# Patient Record
Sex: Male | Born: 1950 | Race: White | Hispanic: No | Marital: Single | Smoking: Never smoker
Health system: Southern US, Community
[De-identification: ages and names within clinical notes are randomized; demographics above are authoritative.]

## PROBLEM LIST (undated history)

## (undated) DIAGNOSIS — I5032 Chronic diastolic (congestive) heart failure: Secondary | ICD-10-CM

## (undated) DIAGNOSIS — L03031 Cellulitis of right toe: Secondary | ICD-10-CM

## (undated) DIAGNOSIS — K7460 Unspecified cirrhosis of liver: Secondary | ICD-10-CM

## (undated) DIAGNOSIS — E785 Hyperlipidemia, unspecified: Secondary | ICD-10-CM

## (undated) DIAGNOSIS — I2510 Atherosclerotic heart disease of native coronary artery without angina pectoris: Secondary | ICD-10-CM

## (undated) DIAGNOSIS — I428 Other cardiomyopathies: Secondary | ICD-10-CM

## (undated) DIAGNOSIS — F329 Major depressive disorder, single episode, unspecified: Secondary | ICD-10-CM

## (undated) DIAGNOSIS — I10 Essential (primary) hypertension: Secondary | ICD-10-CM

## (undated) DIAGNOSIS — F32A Depression, unspecified: Secondary | ICD-10-CM

## (undated) DIAGNOSIS — M1990 Unspecified osteoarthritis, unspecified site: Secondary | ICD-10-CM

## (undated) DIAGNOSIS — F419 Anxiety disorder, unspecified: Secondary | ICD-10-CM

## (undated) HISTORY — PX: CERVICAL SPINE SURGERY: SHX589

## (undated) HISTORY — DX: Atherosclerotic heart disease of native coronary artery without angina pectoris: I25.10

## (undated) HISTORY — PX: CARPAL TUNNEL RELEASE: SHX101

## (undated) HISTORY — DX: Cellulitis of right toe: L03.031

## (undated) HISTORY — DX: Chronic diastolic (congestive) heart failure: I50.32

## (undated) HISTORY — DX: Other cardiomyopathies: I42.8

## (undated) HISTORY — DX: Hyperlipidemia, unspecified: E78.5

## (undated) HISTORY — PX: COLONOSCOPY: SHX174

---

## 2008-01-01 HISTORY — PX: OTHER SURGICAL HISTORY: SHX169

## 2008-01-08 HISTORY — PX: NM MYOVIEW LTD: HXRAD82

## 2008-01-21 ENCOUNTER — Ambulatory Visit (HOSPITAL_COMMUNITY): Admission: RE | Admit: 2008-01-21 | Discharge: 2008-01-21 | Payer: Self-pay | Admitting: *Deleted

## 2010-02-28 ENCOUNTER — Emergency Department (HOSPITAL_COMMUNITY): Admission: EM | Admit: 2010-02-28 | Discharge: 2010-02-28 | Payer: Self-pay | Admitting: Emergency Medicine

## 2010-03-24 ENCOUNTER — Emergency Department (HOSPITAL_COMMUNITY): Admission: EM | Admit: 2010-03-24 | Discharge: 2010-03-24 | Payer: Self-pay | Admitting: Emergency Medicine

## 2010-05-09 HISTORY — PX: DOPPLER ECHOCARDIOGRAPHY: SHX263

## 2010-05-23 HISTORY — PX: OTHER SURGICAL HISTORY: SHX169

## 2010-05-24 ENCOUNTER — Ambulatory Visit (HOSPITAL_COMMUNITY)
Admission: RE | Admit: 2010-05-24 | Discharge: 2010-05-24 | Payer: Self-pay | Source: Home / Self Care | Attending: Internal Medicine | Admitting: Internal Medicine

## 2010-05-24 HISTORY — PX: CARDIAC CATHETERIZATION: SHX172

## 2010-08-07 LAB — GLUCOSE, CAPILLARY
Glucose-Capillary: 223 mg/dL — ABNORMAL HIGH (ref 70–99)
Glucose-Capillary: 225 mg/dL — ABNORMAL HIGH (ref 70–99)
Glucose-Capillary: 230 mg/dL — ABNORMAL HIGH (ref 70–99)
Glucose-Capillary: 314 mg/dL — ABNORMAL HIGH (ref 70–99)

## 2010-08-07 LAB — BASIC METABOLIC PANEL
BUN: 18 mg/dL (ref 6–23)
CO2: 28 mEq/L (ref 19–32)
Calcium: 9.2 mg/dL (ref 8.4–10.5)
Chloride: 100 mEq/L (ref 96–112)
Creatinine, Ser: 0.81 mg/dL (ref 0.4–1.5)
GFR calc Af Amer: >60 mL/min (ref >60)
GFR calc non Af Amer: >60 mL/min (ref >60)
Glucose, Bld: 227 mg/dL — ABNORMAL HIGH (ref 70–99)
Potassium: 3.7 mEq/L (ref 3.5–5.1)
Sodium: 137 mEq/L (ref 135–145)

## 2010-08-09 LAB — GLUCOSE, CAPILLARY: Glucose-Capillary: 323 mg/dL — ABNORMAL HIGH (ref 70–99)

## 2010-11-25 ENCOUNTER — Inpatient Hospital Stay (HOSPITAL_COMMUNITY)
Admission: EM | Admit: 2010-11-25 | Discharge: 2010-12-01 | DRG: 603 | Disposition: A | Discharge status: Home Health | Payer: Medicare Other | Attending: Internal Medicine | Admitting: Internal Medicine

## 2010-11-25 ENCOUNTER — Emergency Department (HOSPITAL_COMMUNITY): Payer: Medicare Other

## 2010-11-25 DIAGNOSIS — E860 Dehydration: Secondary | ICD-10-CM | POA: Diagnosis present

## 2010-11-25 DIAGNOSIS — R911 Solitary pulmonary nodule: Secondary | ICD-10-CM | POA: Diagnosis present

## 2010-11-25 DIAGNOSIS — E876 Hypokalemia: Secondary | ICD-10-CM | POA: Diagnosis present

## 2010-11-25 DIAGNOSIS — M1990 Unspecified osteoarthritis, unspecified site: Secondary | ICD-10-CM | POA: Diagnosis present

## 2010-11-25 DIAGNOSIS — L03039 Cellulitis of unspecified toe: Principal | ICD-10-CM | POA: Diagnosis present

## 2010-11-25 DIAGNOSIS — I10 Essential (primary) hypertension: Secondary | ICD-10-CM | POA: Diagnosis present

## 2010-11-25 DIAGNOSIS — G4700 Insomnia, unspecified: Secondary | ICD-10-CM | POA: Diagnosis present

## 2010-11-25 DIAGNOSIS — L02619 Cutaneous abscess of unspecified foot: Principal | ICD-10-CM | POA: Diagnosis present

## 2010-11-25 DIAGNOSIS — E119 Type 2 diabetes mellitus without complications: Secondary | ICD-10-CM | POA: Diagnosis present

## 2010-11-25 DIAGNOSIS — I2510 Atherosclerotic heart disease of native coronary artery without angina pectoris: Secondary | ICD-10-CM | POA: Diagnosis present

## 2010-11-25 HISTORY — DX: Essential (primary) hypertension: I10

## 2010-11-25 LAB — URINALYSIS, ROUTINE W REFLEX MICROSCOPIC
Glucose, UA: NEGATIVE mg/dL
Hgb urine dipstick: NEGATIVE
Ketones, ur: 15 mg/dL — AB
Leukocytes, UA: NEGATIVE
Nitrite: NEGATIVE
Protein, ur: 100 mg/dL — AB
Specific Gravity, Urine: >1.03 — ABNORMAL HIGH (ref 1.005–1.030)
pH: 5.5 (ref 5.0–8.0)

## 2010-11-25 LAB — URINE MICROSCOPIC-ADD ON

## 2010-11-25 LAB — CBC
HCT: 41.3 % (ref 39.0–52.0)
MCH: 33.5 pg (ref 26.0–34.0)
MCHC: 34.9 g/dL (ref 30.0–36.0)
MCV: 96 fL (ref 78.0–100.0)
Platelets: 187 10*3/uL (ref 150–400)
RBC: 4.3 MIL/uL (ref 4.22–5.81)
WBC: 23.3 10*3/uL — ABNORMAL HIGH (ref 4.0–10.5)

## 2010-11-25 LAB — DIFFERENTIAL
Basophils Relative: 0 % (ref 0–1)
Eosinophils Absolute: 0 10*3/uL (ref 0.0–0.7)
Eosinophils Relative: 0 % (ref 0–5)
Lymphocytes Relative: 5 % — ABNORMAL LOW (ref 12–46)
Lymphs Abs: 1.3 10*3/uL (ref 0.7–4.0)
Monocytes Absolute: 1.8 10*3/uL — ABNORMAL HIGH (ref 0.1–1.0)
Monocytes Relative: 8 % (ref 3–12)
Neutro Abs: 20.1 10*3/uL — ABNORMAL HIGH (ref 1.7–7.7)
Neutrophils Relative %: 86 % — ABNORMAL HIGH (ref 43–77)

## 2010-11-25 LAB — BASIC METABOLIC PANEL
BUN: 23 mg/dL (ref 6–23)
CO2: 27 mEq/L (ref 19–32)
Calcium: 11 mg/dL — ABNORMAL HIGH (ref 8.4–10.5)
Creatinine, Ser: 0.97 mg/dL (ref 0.50–1.35)
GFR calc Af Amer: >60 mL/min (ref >60)
GFR calc non Af Amer: >60 mL/min (ref >60)
Glucose, Bld: 261 mg/dL — ABNORMAL HIGH (ref 70–99)
Potassium: 3.2 mEq/L — ABNORMAL LOW (ref 3.5–5.1)
Sodium: 133 mEq/L — ABNORMAL LOW (ref 135–145)

## 2010-11-26 ENCOUNTER — Observation Stay (HOSPITAL_COMMUNITY): Payer: Medicare Other

## 2010-11-26 DIAGNOSIS — L03031 Cellulitis of right toe: Secondary | ICD-10-CM

## 2010-11-26 HISTORY — DX: Cellulitis of right toe: L03.031

## 2010-11-26 LAB — CBC
HCT: 36.9 % — ABNORMAL LOW (ref 39.0–52.0)
Hemoglobin: 12.6 g/dL — ABNORMAL LOW (ref 13.0–17.0)
MCH: 33.2 pg (ref 26.0–34.0)
MCHC: 34.1 g/dL (ref 30.0–36.0)
MCV: 97.4 fL (ref 78.0–100.0)
Platelets: 169 10*3/uL (ref 150–400)
RBC: 3.79 MIL/uL — ABNORMAL LOW (ref 4.22–5.81)
RDW: 13.6 % (ref 11.5–15.5)
WBC: 19.3 10*3/uL — ABNORMAL HIGH (ref 4.0–10.5)

## 2010-11-26 LAB — DIFFERENTIAL
Basophils Absolute: 0 10*3/uL (ref 0.0–0.1)
Eosinophils Absolute: 0 10*3/uL (ref 0.0–0.7)
Eosinophils Relative: 0 % (ref 0–5)
Lymphocytes Relative: 8 % — ABNORMAL LOW (ref 12–46)
Lymphs Abs: 1.5 10*3/uL (ref 0.7–4.0)
Monocytes Absolute: 1.2 10*3/uL — ABNORMAL HIGH (ref 0.1–1.0)
Monocytes Relative: 6 % (ref 3–12)
Neutrophils Relative %: 86 % — ABNORMAL HIGH (ref 43–77)

## 2010-11-26 LAB — COMPREHENSIVE METABOLIC PANEL
ALT: 14 U/L (ref 0–53)
AST: 16 U/L (ref 0–37)
Albumin: 3.7 g/dL (ref 3.5–5.2)
CO2: 29 mEq/L (ref 19–32)
Calcium: 9.9 mg/dL (ref 8.4–10.5)
Chloride: 96 mEq/L (ref 96–112)
Creatinine, Ser: 0.92 mg/dL (ref 0.50–1.35)
GFR calc Af Amer: >60 mL/min (ref >60)
GFR calc non Af Amer: >60 mL/min (ref >60)
Glucose, Bld: 216 mg/dL — ABNORMAL HIGH (ref 70–99)
Potassium: 3.5 mEq/L (ref 3.5–5.1)
Sodium: 135 mEq/L (ref 135–145)
Total Bilirubin: 0.4 mg/dL (ref 0.3–1.2)
Total Protein: 7.9 g/dL (ref 6.0–8.3)

## 2010-11-26 LAB — GLUCOSE, CAPILLARY
Glucose-Capillary: 209 mg/dL — ABNORMAL HIGH (ref 70–99)
Glucose-Capillary: 176 mg/dL — ABNORMAL HIGH (ref 70–99)
Glucose-Capillary: 174 mg/dL — ABNORMAL HIGH (ref 70–99)

## 2010-11-26 LAB — PHOSPHORUS: Phosphorus: 2 mg/dL — ABNORMAL LOW (ref 2.3–4.6)

## 2010-11-26 LAB — CARDIAC PANEL(CRET KIN+CKTOT+MB+TROPI)
CK, MB: 1.2 ng/mL (ref 0.3–4.0)
CK, MB: 1.3 ng/mL (ref 0.3–4.0)
Relative Index: 1.2 (ref 0.0–2.5)
Relative Index: INVALID (ref 0.0–2.5)
Total CK: 103 U/L (ref 7–232)
Total CK: 98 U/L (ref 7–232)
Troponin I: <0.3 ng/mL (ref <0.30)
Troponin I: <0.3 ng/mL (ref <0.30)

## 2010-11-26 LAB — HEMOGLOBIN A1C: Mean Plasma Glucose: 197 mg/dL — ABNORMAL HIGH (ref <117)

## 2010-11-26 LAB — LIPID PANEL
Cholesterol: 153 mg/dL (ref 0–200)
LDL Cholesterol: 78 mg/dL (ref 0–99)
VLDL: 50 mg/dL — ABNORMAL HIGH (ref 0–40)

## 2010-11-26 LAB — MAGNESIUM: Magnesium: 1.6 mg/dL (ref 1.5–2.5)

## 2010-11-26 MED ORDER — IOHEXOL 350 MG/ML SOLN
100.0000 mL | Freq: Once | INTRAVENOUS | Status: AC | PRN
  Administered 2010-11-26: 100 mL via INTRAVENOUS

## 2010-11-27 DIAGNOSIS — K7460 Unspecified cirrhosis of liver: Secondary | ICD-10-CM

## 2010-11-27 DIAGNOSIS — I339 Acute and subacute endocarditis, unspecified: Secondary | ICD-10-CM

## 2010-11-27 LAB — CARDIAC PANEL(CRET KIN+CKTOT+MB+TROPI)
CK, MB: 1.4 ng/mL (ref 0.3–4.0)
Relative Index: INVALID (ref 0.0–2.5)
Total CK: 89 U/L (ref 7–232)
Troponin I: <0.3 ng/mL (ref <0.30)

## 2010-11-27 LAB — AFP TUMOR MARKER: AFP-Tumor Marker: 3.5 ng/mL (ref 0.0–8.0)

## 2010-11-27 LAB — DIFFERENTIAL
Basophils Absolute: 0 10*3/uL (ref 0.0–0.1)
Basophils Relative: 0 % (ref 0–1)
Eosinophils Absolute: 0 10*3/uL (ref 0.0–0.7)
Eosinophils Relative: 0 % (ref 0–5)
Lymphocytes Relative: 10 % — ABNORMAL LOW (ref 12–46)
Lymphs Abs: 1.2 10*3/uL (ref 0.7–4.0)
Monocytes Absolute: 0.8 10*3/uL (ref 0.1–1.0)
Monocytes Relative: 6 % (ref 3–12)
Neutro Abs: 10.9 10*3/uL — ABNORMAL HIGH (ref 1.7–7.7)
Neutrophils Relative %: 84 % — ABNORMAL HIGH (ref 43–77)

## 2010-11-27 LAB — BASIC METABOLIC PANEL
BUN: 16 mg/dL (ref 6–23)
CO2: 26 mEq/L (ref 19–32)
Calcium: 9.8 mg/dL (ref 8.4–10.5)
Chloride: 96 mEq/L (ref 96–112)
Creatinine, Ser: 0.68 mg/dL (ref 0.50–1.35)
GFR calc Af Amer: >60 mL/min (ref >60)
GFR calc non Af Amer: >60 mL/min (ref >60)
Glucose, Bld: 213 mg/dL — ABNORMAL HIGH (ref 70–99)
Potassium: 3.7 mEq/L (ref 3.5–5.1)
Sodium: 133 mEq/L — ABNORMAL LOW (ref 135–145)

## 2010-11-27 LAB — CBC
HCT: 39.3 % (ref 39.0–52.0)
Hemoglobin: 13.4 g/dL (ref 13.0–17.0)
MCH: 33.5 pg (ref 26.0–34.0)
MCHC: 34.1 g/dL (ref 30.0–36.0)
MCV: 98.3 fL (ref 78.0–100.0)
Platelets: 137 10*3/uL — ABNORMAL LOW (ref 150–400)
RBC: 4 MIL/uL — ABNORMAL LOW (ref 4.22–5.81)
RDW: 13.6 % (ref 11.5–15.5)
WBC: 13 10*3/uL — ABNORMAL HIGH (ref 4.0–10.5)

## 2010-11-27 LAB — VANCOMYCIN, TROUGH: Vancomycin Tr: <5 ug/mL — ABNORMAL LOW (ref 10.0–20.0)

## 2010-11-27 LAB — GLUCOSE, CAPILLARY
Glucose-Capillary: 197 mg/dL — ABNORMAL HIGH (ref 70–99)
Glucose-Capillary: 265 mg/dL — ABNORMAL HIGH (ref 70–99)
Glucose-Capillary: 166 mg/dL — ABNORMAL HIGH (ref 70–99)

## 2010-11-27 LAB — PROTIME-INR
INR: 1.09 (ref 0.00–1.49)
Prothrombin Time: 14.3 seconds (ref 11.6–15.2)

## 2010-11-28 ENCOUNTER — Encounter: Payer: Self-pay | Admitting: Gastroenterology

## 2010-11-28 DIAGNOSIS — R7881 Bacteremia: Secondary | ICD-10-CM

## 2010-11-28 LAB — GLUCOSE, CAPILLARY
Glucose-Capillary: 120 mg/dL — ABNORMAL HIGH (ref 70–99)
Glucose-Capillary: 226 mg/dL — ABNORMAL HIGH (ref 70–99)

## 2010-11-28 LAB — HEPATITIS B SURFACE ANTIGEN: Hepatitis B Surface Ag: NEGATIVE

## 2010-11-28 LAB — BASIC METABOLIC PANEL
BUN: 14 mg/dL (ref 6–23)
CO2: 26 mEq/L (ref 19–32)
Calcium: 9 mg/dL (ref 8.4–10.5)
Chloride: 100 mEq/L (ref 96–112)
Creatinine, Ser: 0.72 mg/dL (ref 0.50–1.35)
GFR calc Af Amer: >60 mL/min (ref >60)
GFR calc non Af Amer: >60 mL/min (ref >60)
Glucose, Bld: 115 mg/dL — ABNORMAL HIGH (ref 70–99)
Potassium: 3.5 mEq/L (ref 3.5–5.1)
Sodium: 137 mEq/L (ref 135–145)

## 2010-11-28 LAB — CBC
HCT: 40.1 % (ref 39.0–52.0)
Hemoglobin: 13.8 g/dL (ref 13.0–17.0)
MCH: 32.9 pg (ref 26.0–34.0)
MCHC: 34.4 g/dL (ref 30.0–36.0)
MCV: 95.7 fL (ref 78.0–100.0)
Platelets: 169 10*3/uL (ref 150–400)
RBC: 4.19 MIL/uL — ABNORMAL LOW (ref 4.22–5.81)
RDW: 13.2 % (ref 11.5–15.5)
WBC: 11.3 10*3/uL — ABNORMAL HIGH (ref 4.0–10.5)

## 2010-11-28 LAB — DIFFERENTIAL
Basophils Absolute: 0 10*3/uL (ref 0.0–0.1)
Basophils Relative: 0 % (ref 0–1)
Eosinophils Absolute: 0.2 10*3/uL (ref 0.0–0.7)
Eosinophils Relative: 2 % (ref 0–5)
Lymphocytes Relative: 26 % (ref 12–46)
Lymphs Abs: 3 10*3/uL (ref 0.7–4.0)
Monocytes Absolute: 1.1 10*3/uL — ABNORMAL HIGH (ref 0.1–1.0)
Monocytes Relative: 10 % (ref 3–12)
Neutro Abs: 7 10*3/uL (ref 1.7–7.7)
Neutrophils Relative %: 62 % (ref 43–77)

## 2010-11-28 LAB — HEPATITIS B SURFACE ANTIBODY,QUALITATIVE: Hep B S Ab: NEGATIVE

## 2010-11-28 LAB — HEPATITIS C ANTIBODY: HCV Ab: NEGATIVE

## 2010-11-29 LAB — SEDIMENTATION RATE: Sed Rate: 99 mm/hr — ABNORMAL HIGH (ref 0–16)

## 2010-11-29 LAB — GLUCOSE, CAPILLARY

## 2010-11-29 NOTE — Consult Note (Signed)
[name] [name], BAINES NO.:  [id number]  MEDICAL RECORD NO.:  [id number]  LOCATION:  A307                          FACILITY:  [facility]  PHYSICIAN:  Tylynn Braniff L. Juanetta Gosling, M.D.DATE OF BIRTH:  01/04/1951  DATE OF CONSULTATION: DATE OF DISCHARGE:                                CONSULTATION   REASON FOR CONSULTATION:  Abnormal chest CT.  HISTORY:  Mr. Bruso is a 60 year old who has a history of diabetes, hypertension, osteoarthritis and who came to the emergency room because of problems with his toe.  He had some fever and chills associated with this.  After he was seen in the emergency room, he eventually underwent a CT chest looking for pulmonary emboli and was found to have multiple pulmonary nodules suggestive of septic emboli.  He said that he had [place] or [place] and he was concerned that he might have cut his foot.  He has had some nausea and vomiting as well.  As far as any chest symptoms, he says he had a cough for the last several months, but he has not had any shortness of breath.  He does not have any known lung disease.  PAST MEDICAL HISTORY:  As previously mentioned is positive for diabetes, hypertension and osteoarthritis.  He has also had a cardiac cath in 2011, that showed nonobstructive coronary artery occlusive disease.  He has diabetic neuropathy as well pending.  SOCIAL HISTORY:  He is disabled because of his diabetes and arthritis. He does not use any tobacco and has never used any tobacco.  He says that in the remote past he used marijuana, but he never used any intravenous drugs and he uses alcohol occasionally.  FAMILY HISTORY:  Positive for smoking in family members, but he is not aware of any definite diagnoses of COPD or asthma.  His father had cardiac disease.  REVIEW OF SYSTEMS:  Except as mentioned is negative.  PHYSICAL EXAMINATION:  GENERAL:  Well-developed, well-nourished male who does not appear to be in any  acute distress right now. VITAL SIGNS:  Temperature is 98.2, pulse 96, respirations 18, blood pressure 131/81, O2 sats 96% on 2 L. HEENT:  His pupils are reactive.  Nose and throat are clear.  Mucous membranes are moist. NECK:  Supple without masses. CHEST:  No wheezes, rales or rhonchi. HEART:  Regular.  I do not hear a murmur. EXTREMITIES:  His toe shows that he has some erythema and purple that is rounded, some tenderness, but it is not fluctuant. NEUROLOGICAL:  He is grossly intact.  LABORATORY WORK:  On admission, his white count was 23,300, hemoglobin of 14.4 and platelets 187.  BMET showed potassium of 3.2, BUN was 23, creatinine 0.97.  His white count has come down to 13,000 with treatment.  ASSESSMENT:  I agree with treatment plans.  I think he is on appropriate antibiotics.  I would ask for a transesophageal echocardiogram to see if we have vegetations on one of the heart valves that might explain what appears to be septic emboli.  I do not see blood cultures listed and he needs to have blood cultures done as well.  I will go ahead and order that.  I will discuss with hospitalist attending about getting a transesophageal echo.     Emaline Karnes L. Juanetta Gosling, M.D.     ELH/MEDQ  D:  11/27/2010  T:  11/27/2010  Job:  [id number]  cc:   Catalina Pizza, M.D. Fax: [phone]  Electronically Signed by Kari Baars M.D. on 11/29/2010 09:08:38 AM

## 2010-11-29 NOTE — Group Therapy Note (Signed)
  NAMELEELAN, RAJEWSKI NO.:  [id number]  MEDICAL RECORD NO.:  [id number]  LOCATION:  A307                          FACILITY:  [facility]  PHYSICIAN:  Sravya Grissom L. Juanetta Gosling, M.D.DATE OF BIRTH:  [birth year]  DATE OF PROCEDURE:  11/28/2010 DATE OF DISCHARGE:                                PROGRESS NOTE   Mr. Kapur is admitted with pulmonary nodules.  He is also got an infection in his toe.  He has had evaluation for cirrhosis.  He is being set for transesophageal echocardiogram and I agree with current treatments.     Caeleb Batalla L. Juanetta Gosling, M.D.     Gwenlyn Found  D:  11/28/2010  T:  11/28/2010  Job:  [id number]  Electronically Signed by Kari Baars M.D. on 11/29/2010 09:08:44 AM

## 2010-11-30 ENCOUNTER — Other Ambulatory Visit: Payer: Self-pay | Admitting: Pulmonary Disease

## 2010-11-30 ENCOUNTER — Inpatient Hospital Stay (HOSPITAL_COMMUNITY): Payer: Medicare Other

## 2010-11-30 ENCOUNTER — Encounter (HOSPITAL_COMMUNITY): Payer: Self-pay | Admitting: Radiology

## 2010-11-30 LAB — GLUCOSE, CAPILLARY
Glucose-Capillary: 103 mg/dL — ABNORMAL HIGH (ref 70–99)
Glucose-Capillary: 148 mg/dL — ABNORMAL HIGH (ref 70–99)
Glucose-Capillary: 141 mg/dL — ABNORMAL HIGH (ref 70–99)

## 2010-11-30 LAB — COMPREHENSIVE METABOLIC PANEL
ALT: 37 U/L (ref 0–53)
AST: 39 U/L — ABNORMAL HIGH (ref 0–37)
CO2: 28 mEq/L (ref 19–32)
Calcium: 9.5 mg/dL (ref 8.4–10.5)
Creatinine, Ser: 0.85 mg/dL (ref 0.50–1.35)
GFR calc Af Amer: >60 mL/min (ref >60)
GFR calc non Af Amer: >60 mL/min (ref >60)
Glucose, Bld: 77 mg/dL (ref 70–99)
Sodium: 142 mEq/L (ref 135–145)
Total Protein: 7.6 g/dL (ref 6.0–8.3)

## 2010-11-30 LAB — CBC
HCT: 39.3 % (ref 39.0–52.0)
Hemoglobin: 13.6 g/dL (ref 13.0–17.0)
RBC: 4.14 MIL/uL — ABNORMAL LOW (ref 4.22–5.81)
WBC: 14.2 10*3/uL — ABNORMAL HIGH (ref 4.0–10.5)

## 2010-11-30 LAB — DIFFERENTIAL
Basophils Absolute: 0.2 10*3/uL — ABNORMAL HIGH (ref 0.0–0.1)
Eosinophils Relative: 3 % (ref 0–5)
Lymphocytes Relative: 35 % (ref 12–46)
Lymphs Abs: 5 10*3/uL — ABNORMAL HIGH (ref 0.7–4.0)
Neutro Abs: 7.4 10*3/uL (ref 1.7–7.7)

## 2010-11-30 LAB — SURGICAL PCR SCREEN
MRSA, PCR: POSITIVE — AB
Staphylococcus aureus: POSITIVE — AB

## 2010-11-30 LAB — VANCOMYCIN, TROUGH: Vancomycin Tr: <5 ug/mL — ABNORMAL LOW (ref 10.0–20.0)

## 2010-11-30 LAB — HIV ANTIBODY (ROUTINE TESTING W REFLEX): HIV: NONREACTIVE

## 2010-11-30 LAB — CRYPTOCOCCAL ANTIGEN: Crypto Ag: NEGATIVE

## 2010-11-30 LAB — ANA: Anti Nuclear Antibody(ANA): NEGATIVE

## 2010-11-30 LAB — HEPATITIS A ANTIBODY, IGM: Hep A IgM: NEGATIVE

## 2010-11-30 MED ORDER — IOHEXOL 300 MG/ML  SOLN
100.0000 mL | Freq: Once | INTRAMUSCULAR | Status: AC | PRN
  Administered 2010-11-30: 100 mL via INTRAVENOUS

## 2010-12-01 LAB — GLUCOSE, CAPILLARY
Glucose-Capillary: 169 mg/dL — ABNORMAL HIGH (ref 70–99)
Glucose-Capillary: 193 mg/dL — ABNORMAL HIGH (ref 70–99)

## 2010-12-01 MED ORDER — LIDOCAINE 5 % EX PTCH
1.0000 | MEDICATED_PATCH | CUTANEOUS | Status: DC
  Filled 2010-12-01 (×3): qty 1

## 2010-12-01 MED ORDER — SODIUM CHLORIDE 0.9 % IV SOLN: INTRAVENOUS | Status: DC

## 2010-12-01 MED ORDER — ACETAMINOPHEN 325 MG PO TABS: 650.0000 mg | ORAL_TABLET | ORAL | Status: DC | PRN

## 2010-12-01 MED ORDER — MUPIROCIN CALCIUM 2 % NA OINT
TOPICAL_OINTMENT | Freq: Two times a day (BID) | NASAL | Status: DC
  Filled 2010-12-01 (×5): qty 1

## 2010-12-01 MED ORDER — INSULIN ASPART 100 UNIT/ML ~~LOC~~ SOLN: 0.0000 [IU] | Freq: Three times a day (TID) | SUBCUTANEOUS | Status: DC

## 2010-12-01 MED ORDER — DOXYCYCLINE HYCLATE 100 MG PO TABS: 100.0000 mg | ORAL_TABLET | Freq: Two times a day (BID) | ORAL | Status: DC

## 2010-12-01 MED ORDER — INSULIN ASPART PROT & ASPART (70-30 MIX) 100 UNIT/ML ~~LOC~~ SUSP: 65.0000 [IU] | Freq: Every day | SUBCUTANEOUS | Status: DC

## 2010-12-01 MED ORDER — INSULIN ASPART 100 UNIT/ML ~~LOC~~ SOLN
0.0000 [IU] | Freq: Every day | SUBCUTANEOUS | Status: DC
Start: 2010-12-01 — End: 2010-12-01

## 2010-12-01 MED ORDER — PROMETHAZINE HCL 25 MG/ML IJ SOLN: 6.2500 mg | Freq: Four times a day (QID) | INTRAMUSCULAR | Status: DC | PRN

## 2010-12-01 MED ORDER — ONDANSETRON HCL 4 MG PO TABS: 4.0000 mg | ORAL_TABLET | Freq: Four times a day (QID) | ORAL | Status: DC | PRN

## 2010-12-01 MED ORDER — ENOXAPARIN SODIUM 40 MG/0.4ML ~~LOC~~ SOLN: 40.0000 mg | SUBCUTANEOUS | Status: DC

## 2010-12-01 MED ORDER — HYDROCHLOROTHIAZIDE 25 MG PO TABS: 12.5000 mg | ORAL_TABLET | Freq: Every day | ORAL | Status: DC

## 2010-12-01 MED ORDER — GABAPENTIN 100 MG PO CAPS: 100.0000 mg | ORAL_CAPSULE | Freq: Two times a day (BID) | ORAL | Status: DC

## 2010-12-01 MED ORDER — ASPIRIN EC 81 MG PO TBEC: 81.0000 mg | DELAYED_RELEASE_TABLET | Freq: Every day | ORAL | Status: DC

## 2010-12-01 MED ORDER — CHLORHEXIDINE GLUCONATE CLOTH 2 % EX PADS
1.0000 | MEDICATED_PAD | Freq: Every day | CUTANEOUS | Status: DC
  Filled 2010-12-01 (×3): qty 1

## 2010-12-01 MED ORDER — ONDANSETRON HCL 4 MG/2ML IJ SOLN: 4.0000 mg | Freq: Four times a day (QID) | INTRAMUSCULAR | Status: DC | PRN

## 2010-12-01 MED ORDER — OMEGA-3-ACID ETHYL ESTERS 1 G PO CAPS: 1.0000 g | ORAL_CAPSULE | Freq: Two times a day (BID) | ORAL | Status: DC

## 2010-12-01 MED ORDER — ZOLPIDEM TARTRATE 5 MG PO TABS: 5.0000 mg | ORAL_TABLET | Freq: Every evening | ORAL | Status: DC | PRN

## 2010-12-01 MED ORDER — PROMETHAZINE HCL 12.5 MG PO TABS: 12.5000 mg | ORAL_TABLET | Freq: Four times a day (QID) | ORAL | Status: DC | PRN

## 2010-12-01 MED ORDER — PANTOPRAZOLE SODIUM 40 MG PO TBEC: 40.0000 mg | DELAYED_RELEASE_TABLET | Freq: Every day | ORAL | Status: DC

## 2010-12-01 MED ORDER — TRAZODONE 25 MG HALF TABLET
25.0000 mg | ORAL_TABLET | Freq: Every evening | ORAL | Status: DC | PRN
Start: 2010-12-01 — End: 2010-12-01
  Filled 2010-12-01: qty 1

## 2010-12-01 MED ORDER — LORATADINE 10 MG PO TABS: 10.0000 mg | ORAL_TABLET | Freq: Every day | ORAL | Status: DC

## 2010-12-01 MED ORDER — INSULIN ASPART PROT & ASPART (70-30 MIX) 100 UNIT/ML ~~LOC~~ SUSP: 62.0000 [IU] | Freq: Every day | SUBCUTANEOUS | Status: DC

## 2010-12-01 MED ORDER — BIOTENE DRY MOUTH MT LIQD
Freq: Two times a day (BID) | OROMUCOSAL | Status: DC
  Filled 2010-12-01 (×5): qty 10

## 2010-12-01 MED ORDER — MORPHINE SULFATE 2 MG/ML IJ SOLN: 1.0000 mg | INTRAMUSCULAR | Status: DC | PRN

## 2010-12-01 NOTE — H&P (Signed)
[name] [name], PATLAN NO.:  [id number]  MEDICAL RECORD NO.:  [id number]  LOCATION:  A307                          FACILITY:  [facility]  PHYSICIAN:  Rosanna Randy, MDDATE OF BIRTH:  Mar 20, 1951  DATE OF ADMISSION:  11/25/2010 DATE OF DISCHARGE:  LH                             HISTORY & PHYSICAL   PRIMARY CARE PHYSICIAN:  Catalina Pizza, MD  CHIEF COMPLAINT:  Right toe erythema, swelling, and tenderness, also nausea, vomiting, fever, dehydration.  HISTORY OF PRESENT ILLNESS:  [name] [name] is a 60 year old male with past medical history significant for diabetes, hypertension, and osteoarthritis, who came to the emergency department complaining of a right foot toe/sole swelling, redness, and tenderness for the last 4 days prior to admission.  The patient reports that he had been having this pain on his right toe for the last 4 days that he noticed for last two, swelling, worsening of the tenderness, and also redness, and due to the fact that he is diabetic, he was really concerned and came to the emergency department.  The patient endorses associated fever/chills, and also nausea and vomiting with difficulty to keep things down with more than 10 episodes of vomiting according to the patient.  There was no hematemesis or coffee-ground emesis despite all this vomiting.  The patient denies any shortness of breath, any chest pain, any dysuria, any abdominal pain, or any other discomfort, but he endorses that he had been noticing increased frequency.  In the emergency department, he was found with an elevated specific gravity and BUN, dry mucous membranes, tachycardia elevated white blood cells more than more than 23,000, hyperglycemia, and also febrile and with hypokalemia.  At that moment, [facility] was called for further evaluation and treatment.  ALLERGIES:  The patient is allergic to NAPROXEN, allergic reaction is rash of his skin and hives.  PAST MEDICAL  HISTORY:  Significant for diabetes, hypertension, osteoarthritis, insomnia, and also nonobstructive coronary artery disease with cath in 2011 demonstrated an LAD with approximately 30% stenosis, neuropathy.  MEDICATIONS:  A med rec was pending at the moment of this dictation, but the patient is on metformin 1000 mg twice a day, HCTZ 12.5 mg once a day, fish oil 1000 mg twice a day, insulin 70/30, 94 units twice a day, gabapentin 100 mg twice a day, trazodone 5 mg at bedtime.  SOCIAL HISTORY:  The patient is on disability secondary to his diabetes and osteoarthritis.  He denies any tobacco abuse or any recreational drugs/illicit drugs consumption.  He reports occasional drinking.  FAMILY HISTORY:  Significant for diabetes and also heart disease, specifically in his dad (heart problems).  REVIEW OF SYSTEMS:  Negative otherwise as mentioned on HPI.  PHYSICAL EXAMINATION:  VITAL SIGNS:  Temperature 99.5, heart rate 104, respiratory rate 19, blood pressure 115/67, oxygen saturation 92% on room air. GENERAL:  The patient was lying in bed, mild distress, complaining of being cold and having some chills.  He was also complaining of right foot pain. HEENT:  Normocephalic.  No trauma.  Eyes:  PERRLA.  Extraocular muscles intact.  No nystagmus.  No icterus.  There was no drainage or discharges from  his ears or nostrils.  Fair dentition.  Couple of teeth were missing.  No exudates.  No erythema.  There was a moderate dryness of his mucous membrane. NECK:  Supple.  The were no bruits or thyromegaly. RESPIRATORY:  Clear to auscultation bilaterally. CARDIOVASCULAR:  Tachycardia.  No murmurs, gallops, or rubs. ABDOMEN:  Soft, nontender, nondistended with positive bowel sounds. EXTREMITIES:  There was no edema, no cyanosis or clubbing.  The patient with right toe erythema and significant tenderness to palpation with mild decrease of motion.  There was no fluid appreciated in his  joint. NEUROLOGIC:  The patient was alert, awake, and oriented x3.  Cranial nerves II-XII intact.  Muscle strength 4/5 bilaterally symmetrically secondary to poor effort.  The patient endorses that he was really tired.  No focal neurologic deficit was appreciated.  Gait was not evaluated due to the fact that the patient was having significant discomfort putting weight on his right feet.  LABORATORY DATA:  We have a BMET with a sodium of 133, potassium 3.2, chloride 91, bicarb 27, glucose 261, BUN 23, creatinine 0.97, calcium 11.0.  Urinalysis demonstrated negative blood, negative glucose, specific gravity more than 1.030, no nitrites, no leukocytes. Microscopy, no white blood cells, no red blood cells.  A CBC with differential showed white blood cells of 23.3, hemoglobin 14.4, platelets 187, ANC 20.1.  There was right foot x-ray 3-views that demonstrated soft tissue swelling that may represent cellulitis without plain film evidence of osteomyelitis.  ASSESSMENT AND PLAN: 1. Right foot cellulitis.  Due to the history of being diabetic and     difficulty of keeping things down plus the association with fever     and chills, we are going to start the patient on vancomycin and     Zosyn.  We are going to provide fluid resuscitation, pain control,     antiemetics.  We are going to observe how the patient responds to     this therapy, and as soon as we can make the transition safely to     p.o. antibiotics, we are going to discharge home to complete     antibiotic therapy. 2. Leukocytosis secondary to right foot cellulitis and also     dehydration.  We are going to start treatment with antibiotic and     fluid resuscitation. 3. Diabetes with hyperglycemia.  We are going to start the patient on     a sliding scale and we will also continue his 70/30.  Since the     patient had not been really eating and drinking much, we are going     to start him on half of the dose that he was using at  home     approximately with just 50 units twice a day and we are going to     increase the dose of his insulin for better control or his blood     sugar depending how he fluctuates.  We are going to also start     diabetic diet with a low carbohydrates.  We are going to check a     hemoglobin A1c. 4. Hypertension.  We are going to continue his HCTZ. 5. Hypokalemia secondary to volume contraction secondary to the use of     diuretics.  We are going to check a magnesium level, phosphorus     level, and we are going to also replete his potassium. 6. Osteoarthritis.  We are going to use Tylenol/p.r.n. morphine. 7. Nausea and vomiting.  We are going to provide antiemetics and fluid     resuscitation. 8. Insomnia.  We are going to continue his trazodone and he is not     really falling asleep with this therapeutic approach.  We are going     to use 5 mg of Ambien as needed. 9. Deep venous thrombosis.  We are going to use Lovenox. 10.Hypercalcemia, most likely secondary to the use of HCTZ in the     presentation of dehydration.  We are going to provide fluid     resuscitation and we are going to follow his calcium levels.  Further treatment and studies are going to depend on hospital course and the patient evolution.     Rosanna Randy, MD CEM/MEDQ  D:  11/26/2010  T:  11/26/2010  Job:  [id number]  cc:   Catalina Pizza, M.D. Fax: [phone]  Electronically Signed by Vassie Loll MD on 12/01/2010 02:09:54 PM

## 2010-12-02 LAB — CULTURE, BLOOD (ROUTINE X 2): Culture: NO GROWTH

## 2010-12-03 LAB — CULTURE, BAL-QUANTITATIVE W GRAM STAIN

## 2010-12-13 NOTE — Discharge Summary (Signed)
NAMENORWOOD, QUEZADA NO.:  [id number]  MEDICAL RECORD NO.:  [id number]  LOCATION:  A307                          FACILITY:  [facility]  PHYSICIAN:  Peggye Pitt, M.D. DATE OF BIRTH:  [birth year]  DATE OF ADMISSION:  11/25/2010 DATE OF DISCHARGE:  07/06/2012LH                              DISCHARGE SUMMARY   PRIMARY CARE PHYSICIAN:  Catalina Pizza, MD  PULMONOLOGIST:  Oneal Deputy. Juanetta Gosling, MD  DISCHARGE DIAGNOSES: 1. Innumerable pulmonary nodules, of undetermined etiology. 2. Right great toe cellulitis. 3. Insulin-dependent diabetes mellitus. 4. Hypertension. 5. Nonobstructive coronary artery disease with cardiac catheterization     in December 2011, that showed a 30% stenosis of the left anterior     descending coronary artery.  DISCHARGE MEDICATIONS: 1. Doxycycline 100 mg twice daily for 14 days. 2. Neurontin 100 mg twice daily. 3. Insulin 70/30, 65 units in the morning and 62 units at night. 4. Aspirin 81 mg daily. 5. Fish oil 1000 mg twice daily. 6. Hydrochlorothiazide 12.5 mg daily. 7. Vicodin 5/500 mg 1 tablet as needed for pain. 8. Metformin 1000 mg twice daily.  He should stop this for now and     resume taking it on July 10,2 012. 9. Trazodone 50 mg at bedtime as needed for sleep.  DISPOSITION AND FOLLOWUP:  Mr. Turnbaugh will be sent home today in stable condition.  He will need to follow up with Dr. Juanetta Gosling, early next week for results on all of his cultures and biopsy results from the bronchoscopy.  Please see below for details.  IMAGES AND PROCEDURES PERFORMED THIS HOSPITALIZATION: 1. A chest x-ray on November 26, 2010, that showed diffuse nodularity     throughout both lungs suspicious for atypical infection or     metastatic disease. 2. A right foot x-ray on November 25, 2010, showed soft tissue swelling     which may represent cellulitis without plain film evidence of     osteomyelitis. 3. A CT angiogram of the chest on November 26, 2010, that showed no PE    identified.  Innumerable pulmonary nodules, favor septic emboli.     4.  A CT scan of the abdomen and pelvis on November 30, 2010, that     showed interval decrease in size of the pulmonary nodules, image     within the bases.  There is a cirrhotic liver morphology and 2     indeterminate subcentimeter hypodensities within the right hepatic     lobe.  Further characterization with liver MRI is recommended.  CONSULTATION THIS HOSPITALIZATION:  Edward L. Juanetta Gosling, MD with Pulmonology, and Jonette Eva, MD, with GI.  HISTORY AND PHYSICAL:  For complete details, please see dictation on November 26, 2010, by Dr. Gwenlyn Perking, but in brief, Mr. Geer is a pleasant 60- year-old Caucasian gentleman with history significant for diabetes, hypertension, and nonobstructive coronary artery disease who presented on November 26, 2010, with complaints of right toe edema and swelling.  He was thought to have cellulitis and was admitted to our service for further evaluation.  HOSPITAL COURSE BY PROBLEM: 1. Right toe cellulitis.  This has been treated with IV antibiotics  consisting of vancomycin and Zosyn.  It is improving.  We will     transition him over to doxycycline to complete a 14-day course as     an outpatient.  He will need followup with his primary care     physician, Dr. Margo Aye, and this should occur approximately 1 week     after discharge. 2. Pulmonary nodules.  On hospital day #3, the patient complained of a     cough.  This prompted an order for a chest x-ray with findings as     above.  Subsequent CT angiogram of the chest was also ordered with     findings as above.  Initially, we thought this could be septic emboli.  The     question was from what source.  The patient had a 2-D     echocardiogram followed by a transesophageal echocardiogram that     did not demonstrate any evidence of valvular vegetations or PFO's.     The patient's blood cultures have remained negative.  He has had an     extensive  infectious workup including HIV, which has been negative.     The patient had a bronchoscopy on October 31, 2010, with no gross     abnormalities, however, cultures were sent for routine, fungi,     Nocardia, AFB.  Biopsies and cytology washings have also been sent.     They also ordered a histoplasma urinary antigen.  Given that the     patient appears nontoxic, I doubt that this is indeed infectious in     etiology, because of this we have ordered of rheumatological titers     including an ANA which has come back negative and ESR is elevated     at 99, of course this is nonspecific.  ANCA has also been ordered     which is pending at the time of this dictation.  Other     considerations include malignancy, although we do not have a     primary source at this time.  A CT of the abdomen and pelvis was     ordered and it did show a cirrhotic liver with 2 indeterminate very     small hypodensities within the right hepatic lobe.  The radiologist     is recommending MRI, however, the patient is very adamant that he     is leaving the hospital today and is unwilling to stay for an MRI.     Please note that an alpha-fetoprotein was ordered and was normal at     3.5.  I do believe that the MRI needs to be performed as an     outpatient to further characterize these nodules within the liver.     If infectious workup comes back negative,     then I believe he will need an open lung biopsy to further     characterize these lesions.  All the rest of his chronic conditions     have been stable.  Please note that we have lowered the dose of his     70/30 insulin because of some hypoglycemia while in the hospital.     Peggye Pitt, M.D.     EH/MEDQ  D:  12/01/2010  T:  12/02/2010  Job:  [id number]  cc:   Catalina Pizza, M.D. Fax: [phone]  Oneal Deputy. Juanetta Gosling, M.D. Fax: [phone]  Jonette Eva, M.D. [address] [place] , [place] [address]  Electronically Signed by Peggye Pitt  M.D. on  12/13/2010 07:45:08 AM

## 2010-12-14 NOTE — Progress Notes (Signed)
  NAMEXAIVIER, MALAY NO.:  [id number]  MEDICAL RECORD NO.:  [id number]  LOCATION:  A307                          FACILITY:  [facility]  PHYSICIAN:  Demaris Bousquet L. Juanetta Gosling, M.D.DATE OF BIRTH:  09-08-1950  DATE OF PROCEDURE: DATE OF DISCHARGE:                                PROGRESS NOTE   Patient of the [facility].  Mr. Balthazor seems to be doing better.  He had bronchoscopy done yesterday with no definite endobronchial lesions.  Biopsies were done, washings were done, brushings were done and all of that is pending.  He says he feels well.  Temperature is 98.7, pulse 85, respirations 18, blood pressure 153/80 and O2 sats 94% on room air.  His chest is clear.  His heart is regular. He looks comfortable.  Blood cultures are still negative thus far.  He is positive by PCR screening for staph.  Assessment is that he has abnormalities on x-ray that could be any of a number of things.  It was felt initially that these might be septic emboli, but that seems to fit the picture less now.  I would plan to send him home on antibiotics probably doxycycline since that may be staph in his toe and then have a followup on his bronchoscopy.     Asheton Scheffler L. Juanetta Gosling, M.D.     ELH/MEDQ  D:  12/01/2010  T:  12/01/2010  Job:  [id number]  Electronically Signed by Kari Baars M.D. on 12/14/2010 05:54:44 PM

## 2010-12-14 NOTE — Progress Notes (Signed)
  [name] [name], [name] [name] NO.:  [id number]  MEDICAL RECORD NO.:  [id number]  LOCATION:  A307                          FACILITY:  [facility]  PHYSICIAN:  Prue Lingenfelter L. Juanetta Gosling, M.D.DATE OF BIRTH:  January 31, 1951  DATE OF PROCEDURE: DATE OF DISCHARGE:                                PROGRESS NOTE   [name] says he is doing okay.  He is set for bronchoscopy later today.  He has not got any new complaints.  He still has a low-grade fever at 99.1, pulse is 86, respirations are 16, blood pressure 131/74.  His chest is clear.  He looks better.  ASSESSMENT:  He is improving.  Plan is to continue with his current treatments and medications.  No changes today.  He will have a bronchoscopy later today.     [name] L. Juanetta Gosling, M.D.     ELH/MEDQ  D:  11/30/2010  T:  11/30/2010  Job:  [id number]  Electronically Signed by Kari Baars M.D. on 12/14/2010 05:54:32 PM

## 2010-12-14 NOTE — Op Note (Signed)
  [name] [name], TEALL NO.:  [id number]  MEDICAL RECORD NO.:  [id number]  LOCATION:  A307                          FACILITY:  [facility]  PHYSICIAN:  Rhyker Silversmith L. Juanetta Gosling, M.D.DATE OF BIRTH:  11-15-1950  DATE OF PROCEDURE:  11/30/2010 DATE OF DISCHARGE:                              OPERATIVE REPORT   INDICATIONS FOR PROCEDURE:  Mr. Kennerly is undergoing bronchoscopy because he has an abnormal chest CT with innumerable pulmonary nodules that are suggestive of septic emboli.  He had a TEE that was negative for vegetations on the heart valve, so he is undergoing bronchoscopy to see if we do demonstrate emboli.  PREOPERATIVE DIAGNOSIS:  Abnormal chest CT.  POSTOPERATIVE DIAGNOSIS:  Abnormal chest CT.  SURGEON:  Alisha Burgo L. Juanetta Gosling, MD  BODY OF THE REPORT:  After satisfactory local anesthesia and a total of 15 mg of Versed intravenously, the bronchoscope was introduced through the right naris.  The upper airway was inspected and found to be within normal limits.  Vocal cords were identified and anesthetized with Xylocaine.  The bronchoscope was introduced into the trachea.  There was some irritation of the tracheal mucosa.  There were no endobronchial lesions seen in either the left or right lung.  Using fluoroscopic guidance, multiple transbronchial biopsies were made of the right lower lobe.  Washings were also made of the right lower lobe and brushings were made of the right lower lobe.  The patient tolerated the procedure well.  Specimens were sent for path.     Sheilia Reznick L. Juanetta Gosling, M.D.     ELH/MEDQ  D:  11/30/2010  T:  11/30/2010  Job:  [id number]  cc:   Catalina Pizza, M.D. Fax: [phone]  Electronically Signed by Kari Baars M.D. on 12/14/2010 05:54:01 PM

## 2010-12-14 NOTE — Progress Notes (Signed)
  NAMEARMONDO, CECH NO.:  [id number]  MEDICAL RECORD NO.:  [id number]  LOCATION:  A307                          FACILITY:  [facility]  PHYSICIAN:  Kane Kusek L. Juanetta Gosling, M.D.DATE OF BIRTH:  August 21, 1950  DATE OF PROCEDURE: DATE OF DISCHARGE:                                PROGRESS NOTE   Patient of the [facility].  Mr. Markuson says he is feeling okay.  He has 2 or 3 lesions on his right leg now.  His toe looks a little bit better, but he has got a blister at the heel that was present on admission, now he has got another area that looks like it may be inflamed above the ankle.  He has negative blood culture so far.  Hepatitis lab so far is negative.  Alpha-fetoprotein is 3.5 and overall his TEE did not show vegetations.  Assessment then is that he has got a confusing situation with what looks like septic emboli in the lung.  He has lesions in his toe that could also represent septic emboli, but without a definite source.  My plan then is to perform bronchoscopy tomorrow and see if we can make some sort of a diagnosis.  Obviously, I would be helpful if we had a source of septic emboli.     Audriana Aldama L. Juanetta Gosling, M.D.     ELH/MEDQ  D:  11/29/2010  T:  11/29/2010  Job:  [id number]  Electronically Signed by Kari Baars M.D. on 12/14/2010 05:54:05 PM

## 2010-12-20 NOTE — Consult Note (Signed)
[name], OLDEN NO.:  [id number]  MEDICAL RECORD NO.:  [id number]  LOCATION:  A307                          FACILITY:  [facility]  PHYSICIAN:  [name] [name], M.D.     DATE OF BIRTH:  [birth year]  DATE OF CONSULTATION: DATE OF DISCHARGE:                                CONSULTATION   REQUESTING PHYSICIAN:  [name] [name].  PRIMARY CARE PHYSICIAN:  [name] Pizza, MD  REASON FOR CONSULTATION:  Cirrhosis.  HISTORY OF PRESENT ILLNESS:  [name] [name] is a 60 year old Caucasian male who was admitted to the hospital with right foot cellulitis.  He was found to have multiple pulmonary nodules suspicious for septic emboli on CT angiogram.  He had an incidental finding of cirrhosis of the liver. His LFTs are normal.  He gives no history of liver disease.  He gives no history of abnormal LFTs.  He does consume about a six-pack of beer per week, rarely drinks liquor.  No history of heavy alcohol use.  He does take Vicodin 5/500 mg 6 per day and has for many years for chronic neck pain.  He denies any other over-the-counter acetaminophen use.  There is no family history of liver disease.  He does have, he acquired at age 61.  He denies any history of transfusions, IV drug use or exposure to hepatitis.  He has not had a hepatitis vaccine either.  He denies multiple sexual partners, any history of yellow jaundice.  He denies any abdominal pain.  He has had some dry itchy skin, denies any nausea or vomiting.  His bowel movements have been normal, soft and brown.  Denies any rectal bleeding, melena or clay-colored stools.  He does have diabetes mellitus which is a poorly controlled for about 14 years.  He has been placed on Protonix 40 mg q.12 h.  He is also receiving Lovenox injections.  PAST MEDICAL HISTORY:  Diabetes mellitus, hypertension, osteoarthritis, insomnia coronary artery disease, status post cath in 2011 and neuropathy.  He has had a colonoscopy by Dr.  Karilyn Cota last year which he notes as normal at [facility].  He has had cervical disk surgery and carpal tunnel release.  MEDICATIONS PRIOR TO ADMISSION: 1. Metformin 1 g b.i.d. 2. Hydrochlorothiazide 12.5 mg daily. 3. Fish oil 1 g b.i.d. 4. Insulin 70/30 94 units b.i.d. 5. Gabapentin 100 mg b.i.d. 6. Trazodone 5 mg nightly.  ALLERGIES:  NAPROXEN causes hives.  FAMILY HISTORY:  There is no known family history of colorectal carcinoma of liver, liver disease or chronic GI problems.  Mother aged 33 of diabetes mellitus and COPD.  Father deceased at 73 secondary to MI.  He lost one brother to lung cancer.  He has 5 living brothers and one living healthy sister.  SOCIAL HISTORY:  He does have remote history of marijuana use.  He denies any tobacco or current illicit drug use.  He is disabled.  He lives alone.  He has 5 healthy children.  He is divorced.  REVIEW OF SYSTEMS:  GENERAL:  See HPI.  CARDIOPULMONARY:  He has had chest pain with shortness of breath and some nonproductive cough.  PHYSICAL EXAMINATION:  VITAL SIGNS:  Temp 98.2, pulse 88, respirations 18, blood pressure 131/81, O2 sat 94% on 2 L per minute via nasal cannula. GENERAL:  He is alert, oriented, pleasant and cooperative Caucasian male, in no acute distress.  His weight is 93.9 kg.  His height is 66 inches. HEENT:  Sclerae clear nonicteric.  Conjunctivae pink.  Oropharynx pink and moist without lesions. NECK:  Supple without mass or thyromegaly. CHEST:  Regular rate and rhythm.  Normal S1 and S2 without murmurs, clicks, rubs or gallops. LUNGS:  Clear to auscultation bilaterally. ABDOMEN:  Positive bowel sounds x4.  No bruits auscultated.  Abdomen is slightly protuberant.  There is no hepatosplenomegaly or mass.  Exam is limited due to the patient's body habitus. EXTREMITIES:  Without edema.  He does have erythema to the dorsum of his right great toe.  There is some erythema at the medial malleolus  as well.  Otherwise, no lower extremity edema.  LABORATORY STUDIES:  White blood cell count 13, hemoglobin 13.4, hematocrit 39.3, platelets 137.  Calcium 9.8, sodium 133, potassium 3.7, chloride 96, CO2 26, BUN 16, creatinine 0.68 and glucose 213.  IMPRESSION:  [name] [name] is a 60 year old Caucasian male with incidental finding of cirrhosis on CT angiogram.  He has been admitted with right foot cellulitis and found to have multiple pulmonary emboli suspicious for septic emboli.  He is being evaluated by Pulmonology and has a transesophageal echocardiogram scheduled.  I suspect his cirrhosis may be due to well compensated nonalcoholic fatty liver or nonalcoholic steatohepatitis.  His LFTs are normal.  He is consuming Vicodin which includes about 3 g of acetaminophen per day currently.  No other Tylenol products.  Minimal alcohol use.  PLAN: 1. We will check AST and INR. 2. More details of the liver imaging in 6 weeks in the outpatient once     he is stable from a Pulmonary standpoint. 3. He is to avoid alcohol. 4. Limit Vicodin use at 2 g of acetaminophen daily. 5. We will followup on hepatitis panel.  I would like to thank the Hospitalist team for allowing [place] to participate in the care of [name] [name].     Lorenza Burton, N.P.   ______________________________ [name] [name], M.D.  OPV IN OCT 2012  KJ/MEDQ  D:  11/27/2010  T:  11/27/2010  Job:  [id number]  cc:   [name] [name], M.D. Fax: [phone]  Electronically Signed by Lorenza Burton N.P. on 11/27/2010 04:38:43 PM Electronically Signed by [name] [name] M.D. on 12/20/2010 01:28:42 PM

## 2010-12-29 LAB — FUNGUS CULTURE W SMEAR

## 2011-01-10 ENCOUNTER — Ambulatory Visit (INDEPENDENT_AMBULATORY_CARE_PROVIDER_SITE_OTHER): Payer: PRIVATE HEALTH INSURANCE | Admitting: Gastroenterology

## 2011-01-10 ENCOUNTER — Encounter: Payer: Self-pay | Admitting: Gastroenterology

## 2011-01-10 VITALS — BP 119/72 | HR 68 | Temp 98.6°F | Ht 66.0 in | Wt 205.8 lb

## 2011-01-10 DIAGNOSIS — L03031 Cellulitis of right toe: Secondary | ICD-10-CM

## 2011-01-10 DIAGNOSIS — L03039 Cellulitis of unspecified toe: Secondary | ICD-10-CM

## 2011-01-10 DIAGNOSIS — K7460 Unspecified cirrhosis of liver: Secondary | ICD-10-CM

## 2011-01-10 DIAGNOSIS — E119 Type 2 diabetes mellitus without complications: Secondary | ICD-10-CM

## 2011-01-10 DIAGNOSIS — L02619 Cutaneous abscess of unspecified foot: Secondary | ICD-10-CM

## 2011-01-10 NOTE — Patient Instructions (Addendum)
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 Minimize ALCOHOL use. Lose 10-20 lbs.  STRICT DIABETES CONTROL-HGA1C NEEDS TO BE LESS THAN 7. FOLLOW UP IN DEC 2012.   Fatty Liver Hepatosteatosis, Steatohepatitis Fatty liver is the accumulation of fat in liver cells. It is also called hepatosteatosis or steatohepatitis. It is normal for your liver to contain some fat. If fat is more than 5-10% of your liver's weight, you have fatty liver.  There are often no symptoms (problems) for years while damage is still occurring. People often learn about their fatty liver when they have medical tests for other reasons. Fat can damage your liver for years or even decades without causing problems. When it becomes severe, it can cause fatigue, weight loss, weakness, and confusion. This makes you more likely to develop more serious liver problems. The liver is the largest organ in the body. It does a lot of work and often gives no warning signs when it is sick until late in a disease. The liver has many important jobs including:  Breaking down foods.   Storing vitamins, iron, and other minerals.   Making proteins.   Making bile for food digestion.   Breaking down many products including medications, alcohol and some poisons.    CAUSES There are a number of different conditions, medications, and poisons that can cause a fatty liver. Eating too many calories causes fat to build up in the liver. Not processing and breaking fats down normally may also cause this. Certain conditions, such as obesity, diabetes, and high triglycerides also cause this. Most fatty liver patients tend to be middle-aged and over weight.  Some causes of fatty liver are:  Alcohol consumption.  Malnutrition.   Steroid use.   Valproic acid toxicity.   Obesity.  Cushing's syndrome.   Poisons.   Tetracycline in high dosages.   Pregnancy.  Diabetes.   Hyperlipidemia.   Rapid weight loss.     Some people develop fatty liver even having none of these conditions.  SYMPTOMS Fatty liver most often causes no problems. This is called asymptomatic.  It can be diagnosed with blood tests and also by a liver biopsy.   It is one of the most common causes of minor elevations of liver enzymes on routine blood tests.   Specialized Imaging of the liver using ultrasound, CT (computed tomography) scan, or MRI (magnetic resonance imaging) can suggest a fatty liver.    TREATMENT  It is important to treat the cause. Simple fatty liver without a medical reason may not need treatment.  Weight loss, fat restriction, and exercise in overweight patients produces inconsistent results but is worth trying.   Fatty liver due to alcohol toxicity may not improve even with stopping drinking.   Good control of diabetes may reduce fatty liver.   Lower your triglycerides through diet, medication or both.   Eat a balanced, healthy diet.   Increase your physical activity.   Get regular checkups from a liver specialist.   There are no medical or surgical treatments for a fatty liver or NASH, but improving your diet and increasing your exercise may help prevent or reverse some of the damage.    PROGNOSIS Fatty liver may cause no damage or it can lead to an inflammation of the liver. This is, called steatohepatitis. When it is linked to alcohol abuse, it is called alcoholic steatohepatitis. It often is not linked to alcohol. It is then called nonalcoholic steatohepatitis, or NASH. Over time the  liver may become scarred and hardened. This condition is called cirrhosis. Cirrhosis is serious and may lead to liver failure or cancer. NASH is one of the leading causes of cirrhosis. About 10-20% of Americans have fatty liver and a smaller 2-5% has NASH. Much of this information is from the [employer]. Last reviewed by [facility] 06-28-05 Document Released: 06/29/2005 Document Re-Released:  08/10/2008 [facility] Patient Information 2011 [place], [place].

## 2011-01-10 NOTE — Assessment & Plan Note (Addendum)
EGD TO SCREENING FOR VARICES. Check ferritin  On day of egd, Minimize EtOH use. Lose 10-20 lbs.  STRICT DIABETES CONTROL-HGA1C NEEDS TO BE LESS THAN 7. FOLLOW UP IN DEC 2012. Check AFP. OBTAIN TCS REPORT FROM [facility].  Spent 15 minutes-Explaining cirrhosis, complications, potential causes, lifestyle factors that make it worse, and management. PT VOICED UNDERSTANDING.

## 2011-01-11 ENCOUNTER — Encounter: Payer: Self-pay | Admitting: Gastroenterology

## 2011-01-11 NOTE — Progress Notes (Signed)
Cc to PCP 

## 2011-01-11 NOTE — Progress Notes (Signed)
  Subjective:    Patient ID: [name] [name], male    DOB: [birth year], 60 y.o.   MRN: [id number]  PCP: [facility]  HPI Pt feeling better. No questions or concerns except about cirrhosis. No NVD, melena, or BRBPR. Has carried weight for a long time. Drink EtOH once a month for past 10 years. Used to consume more regularly. Pt unable to quantify consumption-"never considered himself an alcoholic". His father was an alcoholic.  Past Medical History  Diagnosis Date  . Diabetes mellitus   . Hypertension   . Cellulitis of toe, right july 2012  . CAD (coronary artery disease) cath in 2011    Past Surgical History  Procedure Date  . Colonoscopy 2011 NUR [facility]  . Cervical spine surgery   . Carpal tunnel release     Allergies  Allergen Reactions  . Naprosyn (Naproxen) Hives    Current Outpatient Prescriptions  Medication Sig Dispense Refill  . aspirin 81 MG tablet Take 81 mg by mouth daily.        . fish oil-omega-3 fatty acids 1000 MG capsule Take 2 g by mouth daily.        Marland Kitchen gabapentin (NEURONTIN) 100 MG tablet Take 100 mg by mouth 3 (three) times daily.        . hydrochlorothiazide (,MICROZIDE/HYDRODIURIL,) 12.5 MG capsule Take 12.5 mg by mouth daily.        Marland Kitchen HYDROcodone-acetaminophen (VICODIN) 5-500 MG per tablet Take 1 tablet by mouth every 6 (six) hours as needed.        . insulin aspart protamine-insulin aspart (NOVOLOG 70/30) (70-30) 100 UNIT/ML injection Inject 95 Units into the skin 2 (two) times daily with a meal.        . metFORMIN (GLUCOPHAGE) 1000 MG tablet Take 1,000 mg by mouth 2 (two) times daily with a meal.        . TRAZODONE HCL PO Take 50 mg by mouth.          Family History  Problem Relation Age of Onset  . Colon cancer Neg Hx   . Liver disease Neg Hx       Review of Systems     Objective:   Physical Exam  Vitals reviewed. Constitutional: He is oriented to person, place, and time. He appears well-developed and well-nourished. No distress.  HENT:  Head:  Normocephalic and atraumatic.  Neck: Normal range of motion.  Cardiovascular: Normal rate, regular rhythm and normal heart sounds.   Pulmonary/Chest: Effort normal and breath sounds normal.  Abdominal: Soft. Bowel sounds are normal. He exhibits no distension. There is no tenderness.       OBESE  Musculoskeletal: Edema: TRACE BIL.  Neurological: He is alert and oriented to person, place, and time.  Psychiatric: He has a normal mood and affect.          Assessment & Plan:

## 2011-01-14 LAB — AFB CULTURE WITH SMEAR (NOT AT ARMC)

## 2011-01-17 NOTE — Progress Notes (Signed)
Reminder in epic to follow up in Dec 2012 °

## 2011-01-22 NOTE — Progress Notes (Signed)
Check on day of EGD

## 2011-02-06 ENCOUNTER — Other Ambulatory Visit: Payer: Self-pay | Admitting: General Practice

## 2011-02-06 DIAGNOSIS — K7460 Unspecified cirrhosis of liver: Secondary | ICD-10-CM

## 2011-02-06 MED ORDER — SODIUM CHLORIDE 0.45 % IV SOLN
Freq: Once | INTRAVENOUS | Status: AC
  Administered 2011-02-07: 08:00:00 via INTRAVENOUS

## 2011-02-07 ENCOUNTER — Other Ambulatory Visit: Payer: Self-pay | Admitting: Gastroenterology

## 2011-02-07 ENCOUNTER — Encounter (HOSPITAL_COMMUNITY): Payer: Self-pay | Admitting: *Deleted

## 2011-02-07 ENCOUNTER — Ambulatory Visit (HOSPITAL_COMMUNITY)
Admission: RE | Admit: 2011-02-07 | Discharge: 2011-02-07 | Disposition: A | Discharge status: Home / Self Care | Payer: Medicare Other | Source: Ambulatory Visit | Attending: Gastroenterology | Admitting: Gastroenterology

## 2011-02-07 ENCOUNTER — Encounter (HOSPITAL_COMMUNITY)
Admission: RE | Disposition: A | Discharge status: Home / Self Care | Payer: Self-pay | Source: Ambulatory Visit | Attending: Gastroenterology

## 2011-02-07 DIAGNOSIS — A048 Other specified bacterial intestinal infections: Secondary | ICD-10-CM | POA: Insufficient documentation

## 2011-02-07 DIAGNOSIS — K2940 Chronic atrophic gastritis without bleeding: Secondary | ICD-10-CM | POA: Insufficient documentation

## 2011-02-07 DIAGNOSIS — K2980 Duodenitis without bleeding: Secondary | ICD-10-CM | POA: Insufficient documentation

## 2011-02-07 DIAGNOSIS — K7460 Unspecified cirrhosis of liver: Secondary | ICD-10-CM | POA: Insufficient documentation

## 2011-02-07 DIAGNOSIS — Z01812 Encounter for preprocedural laboratory examination: Secondary | ICD-10-CM | POA: Insufficient documentation

## 2011-02-07 HISTORY — DX: Unspecified cirrhosis of liver: K74.60

## 2011-02-07 HISTORY — PX: ESOPHAGOGASTRODUODENOSCOPY: SHX5428

## 2011-02-07 LAB — FERRITIN: Ferritin: 320 ng/mL (ref 22–322)

## 2011-02-07 LAB — GLUCOSE, CAPILLARY

## 2011-02-07 SURGERY — EGD (ESOPHAGOGASTRODUODENOSCOPY)
Anesthesia: Moderate Sedation

## 2011-02-07 MED ORDER — PROMETHAZINE HCL 25 MG/ML IJ SOLN
INTRAMUSCULAR | Status: DC | PRN
  Administered 2011-02-07: 12.5 mg via INTRAVENOUS

## 2011-02-07 MED ORDER — MIDAZOLAM HCL 5 MG/5ML IJ SOLN
INTRAMUSCULAR | Status: AC
  Filled 2011-02-07: qty 10

## 2011-02-07 MED ORDER — MIDAZOLAM HCL 5 MG/5ML IJ SOLN
INTRAMUSCULAR | Status: AC
  Filled 2011-02-07: qty 5

## 2011-02-07 MED ORDER — OMEPRAZOLE 20 MG PO CPDR: DELAYED_RELEASE_CAPSULE | ORAL | Status: DC

## 2011-02-07 MED ORDER — MIDAZOLAM HCL 5 MG/5ML IJ SOLN
INTRAMUSCULAR | Status: DC | PRN
  Administered 2011-02-07: 2 mg via INTRAVENOUS

## 2011-02-07 MED ORDER — BUTAMBEN-TETRACAINE-BENZOCAINE 2-2-14 % EX AERO
INHALATION_SPRAY | CUTANEOUS | Status: DC | PRN
  Administered 2011-02-07: 1 via TOPICAL

## 2011-02-07 MED ORDER — PROMETHAZINE HCL 25 MG/ML IJ SOLN
INTRAMUSCULAR | Status: AC
  Filled 2011-02-07: qty 1

## 2011-02-07 MED ORDER — MEPERIDINE HCL 100 MG/ML IJ SOLN
INTRAMUSCULAR | Status: DC | PRN
  Administered 2011-02-07: 50 mg

## 2011-02-07 MED ORDER — MEPERIDINE HCL 100 MG/ML IJ SOLN
INTRAMUSCULAR | Status: AC
  Filled 2011-02-07: qty 2

## 2011-02-07 NOTE — Interval H&P Note (Signed)
History and Physical Interval Note:   02/07/2011   8:41 AM   [name] [name]  has presented today for surgery, with the diagnosis of CIRRHOSIS  The various methods of treatment have been discussed with the patient and family. After consideration of risks, benefits and other options for treatment, the patient has consented to  Procedure(s): ESOPHAGOGASTRODUODENOSCOPY (EGD) as a surgical intervention .  I have reviewed the patients' chart and labs.  Questions were answered to the patient's satisfaction.     Jonette Eva  MD

## 2011-02-07 NOTE — H&P (Signed)
  In epic 8/15

## 2011-02-14 ENCOUNTER — Telehealth: Payer: Self-pay | Admitting: Gastroenterology

## 2011-02-14 ENCOUNTER — Encounter (HOSPITAL_COMMUNITY): Payer: Self-pay | Admitting: Gastroenterology

## 2011-02-14 DIAGNOSIS — B9681 Helicobacter pylori [H. pylori] as the cause of diseases classified elsewhere: Secondary | ICD-10-CM | POA: Insufficient documentation

## 2011-02-14 NOTE — Telephone Encounter (Signed)
Pt informed. Called Rx to CC at [facility] in [place] with all of the instructions, side effects, etc.

## 2011-02-14 NOTE — Telephone Encounter (Signed)
Results Cc to PCP OPV is nicd in the computer and TCS was requested

## 2011-02-14 NOTE — Telephone Encounter (Signed)
Reminder in epic to follow up in Dec 2012 °

## 2011-02-14 NOTE — Telephone Encounter (Signed)
PLEASE CALL PT. HIS stomach Bx showed H. Pylori infection. He needs AMOXICILLIN 500 mg 2 po BID for 10 days and Biaxin 500 mg po bid for 10 days, #qs, rfx0. He needs Omeprazole 20 mg BID for 10 days then 1 po diail for 3 mos, #30, rfx3. Med side effects include NVD, abd pain, and metallic taste.  OPV IN DEC 2012.  OBTAIN TCS REPORT FROM [facility].

## 2011-02-20 ENCOUNTER — Encounter: Payer: Self-pay | Admitting: Gastroenterology

## 2011-04-12 ENCOUNTER — Encounter: Payer: Self-pay | Admitting: Gastroenterology

## 2011-04-12 ENCOUNTER — Telehealth: Payer: Self-pay | Admitting: Gastroenterology

## 2011-04-13 NOTE — Telephone Encounter (Signed)
error 

## 2012-06-26 IMAGING — CT CT ANGIO CHEST
2 of 6 series · 6 of 36 positions shown · IV contrast (Omnipaque 300)
Comparison: Plain film of 02/28/2010.  No prior CT.

CLINICAL DATA: Cough.  Chest pain.  Rule out pulmonary embolism.

CT ANGIOGRAPHY OF THE CHEST
TECHNIQUE: Multidetector CT angiography of the chest was performed
after contrast with bolus timed to evaluate the pulmonary arteries.
Multiplanar CT image reconstructions including MIPs were obtained
to evaluate the vascular anatomy.
Contrast:  100 ml Omnipaque 350

[Series 4: pe 3.0 b40f · axial · 0.69mm/px · z∈[-242,-41]mm · 5 of 101 slices shown]
[im 17/101  lung]
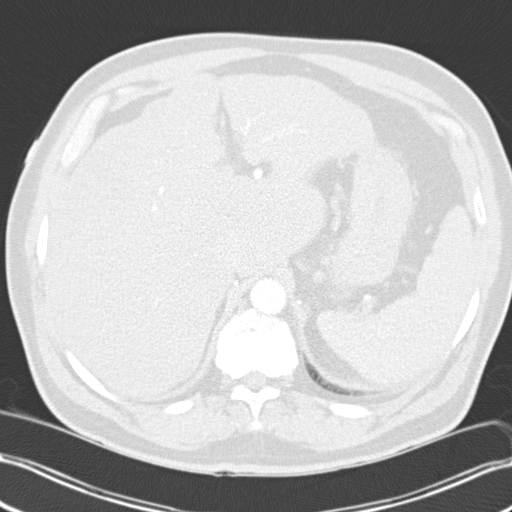
[im 34/101  mediastinal]
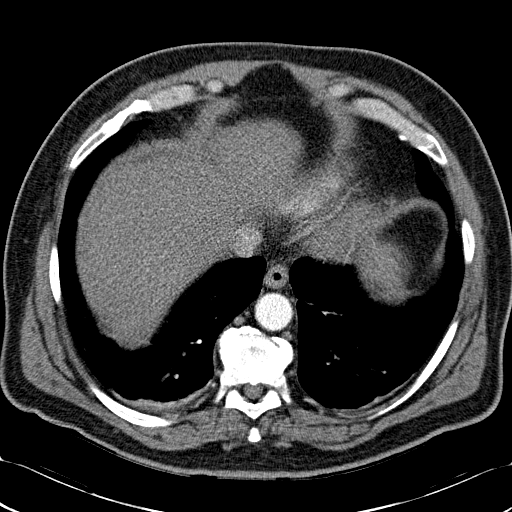
[im 51/101  lung]
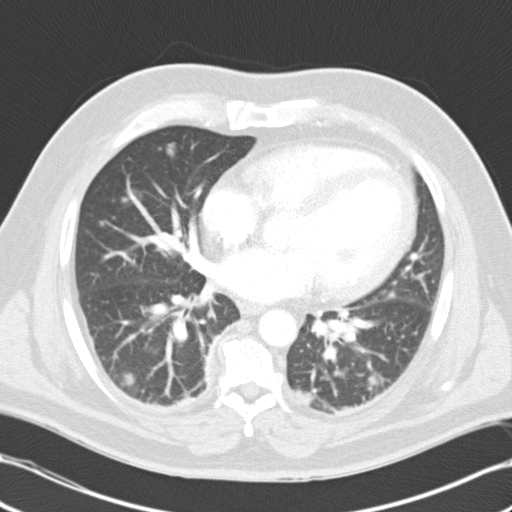
[im 67/101  mediastinal]
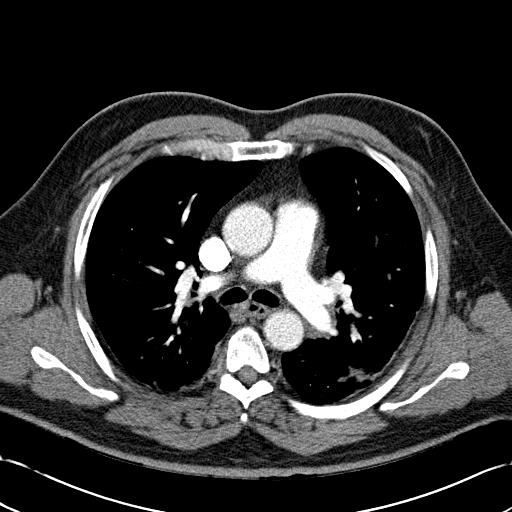
[im 84/101  lung]
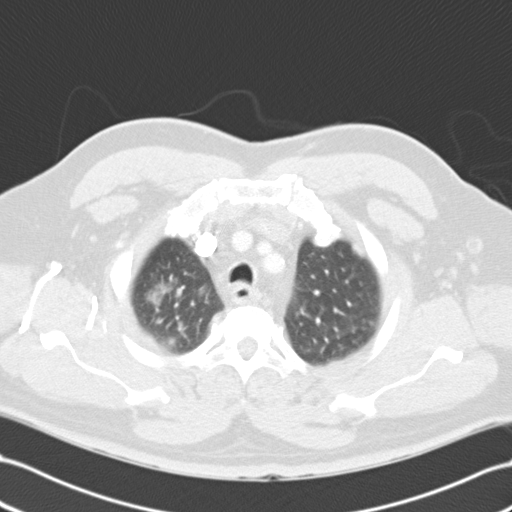

[Series 6: mpr coronal pe 3mm · coronal · 0.61mm/px · 1 of 88 slices shown]
[im 44/88  mediastinal]
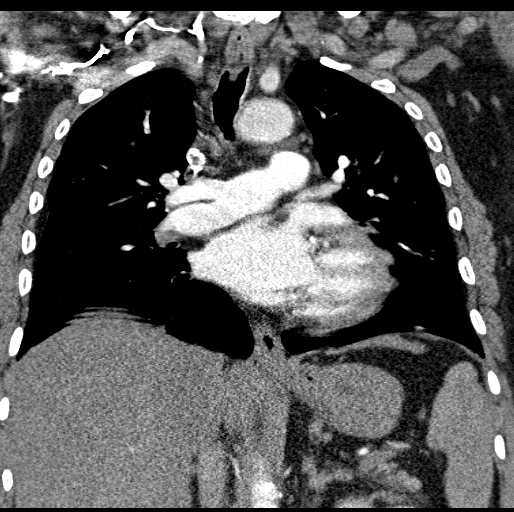

[6 of 36 positions shown; findings below may reference images not displayed]

FINDINGS: Lung windows demonstrate minimal motion degradation.
Innumerable pulmonary nodules, many of which are partially ground-
glass or surrounded by a ground-glass opacity.  Index nodule in the
superior segment left lower lobe measures 2.1 cm on image 37.
Right upper lobe index nodule measures 1.2 cm on image 39.

Soft tissue windows:  The quality of this exam for evaluation of
pulmonary embolism is moderate.  In addition to the mild motion
artifact, the bolus is only moderately well timed.  Given this
factor, no evidence of pulmonary embolism.

Normal aortic caliber without dissection.  Mild cardiomegaly.
Multivessel coronary artery atherosclerosis.  No pericardial or
pleural effusion.  Mildly prominent left suprahilar nodal tissue on
image 33 of series 4 is likely reactive. Similar findings in the
right perihilar region.

Limited abdominal imaging demonstrates moderate to marked
cirrhosis.  Marked enlargement caudate lobe.  Underdistended
stomach.  Degenerative sclerosis of the right sternoclavicular
joint.

 Review of the MIP images confirms the above findings.
IMPRESSION: 1.  Moderate quality exam for pulmonary embolism.  No embolism
identified.
2.  Innumerable pulmonary nodules.  Favor septic emboli.  If the
patient is immune compromised, atypical infection (i.e. fungal)
could look similar.  Metastasis cannot be excluded but are felt
less likely.  Correlate with any primary malignancy.
3.  Moderate to severe cirrhosis, incompletely imaged.

I personally called and discussed this report with Mirjeta, Gashii  at
[DATE] p.m. on 11/26/2010.

## 2014-05-13 ENCOUNTER — Ambulatory Visit (INDEPENDENT_AMBULATORY_CARE_PROVIDER_SITE_OTHER): Payer: Medicare Other | Admitting: Cardiovascular Disease

## 2014-05-13 ENCOUNTER — Encounter: Payer: Self-pay | Admitting: Cardiovascular Disease

## 2014-05-13 VITALS — BP 104/70 | HR 66 | Resp 16 | Ht 66.0 in | Wt 203.2 lb

## 2014-05-13 DIAGNOSIS — I2510 Atherosclerotic heart disease of native coronary artery without angina pectoris: Secondary | ICD-10-CM

## 2014-05-13 DIAGNOSIS — R0789 Other chest pain: Secondary | ICD-10-CM

## 2014-05-13 DIAGNOSIS — I428 Other cardiomyopathies: Secondary | ICD-10-CM

## 2014-05-13 DIAGNOSIS — R0600 Dyspnea, unspecified: Secondary | ICD-10-CM

## 2014-05-13 DIAGNOSIS — E785 Hyperlipidemia, unspecified: Secondary | ICD-10-CM

## 2014-05-13 DIAGNOSIS — R072 Precordial pain: Secondary | ICD-10-CM

## 2014-05-13 DIAGNOSIS — I429 Cardiomyopathy, unspecified: Secondary | ICD-10-CM

## 2014-05-13 DIAGNOSIS — I5032 Chronic diastolic (congestive) heart failure: Secondary | ICD-10-CM

## 2014-05-13 NOTE — Patient Instructions (Signed)
Your physician has requested that you have an echocardiogram. Echocardiography is a painless test that uses sound waves to create images of your heart. It provides your doctor with information about the size and shape of your heart and how well your heart's chambers and valves are working. This procedure takes approximately one hour. There are no restrictions for this procedure.  Your physician has requested that you have a lexiscan myoview. For further information please visit [email or web address]. Please follow instruction sheet, as given.  Dr. Sallyanne Kuster recommends that you schedule a follow-up appointment in: after testing is completed.  We will call you with the results if there is a gap between your tests and follow-up appointment.

## 2014-05-18 ENCOUNTER — Telehealth (HOSPITAL_COMMUNITY): Payer: Self-pay

## 2014-05-18 ENCOUNTER — Encounter: Payer: Self-pay | Admitting: Cardiovascular Disease

## 2014-05-18 DIAGNOSIS — E785 Hyperlipidemia, unspecified: Secondary | ICD-10-CM

## 2014-05-18 DIAGNOSIS — I2510 Atherosclerotic heart disease of native coronary artery without angina pectoris: Secondary | ICD-10-CM

## 2014-05-18 DIAGNOSIS — I5032 Chronic diastolic (congestive) heart failure: Secondary | ICD-10-CM

## 2014-05-18 DIAGNOSIS — R072 Precordial pain: Secondary | ICD-10-CM | POA: Insufficient documentation

## 2014-05-18 DIAGNOSIS — I428 Other cardiomyopathies: Secondary | ICD-10-CM | POA: Insufficient documentation

## 2014-05-18 HISTORY — DX: Chronic diastolic (congestive) heart failure: I50.32

## 2014-05-18 HISTORY — DX: Other cardiomyopathies: I42.8

## 2014-05-18 HISTORY — DX: Atherosclerotic heart disease of native coronary artery without angina pectoris: I25.10

## 2014-05-18 HISTORY — DX: Hyperlipidemia, unspecified: E78.5

## 2014-05-18 NOTE — Progress Notes (Signed)
Patient ID: [name], male   DOB: [birth year], 63 y.o.   MRN: [id number] .     Reason for office visit CAD, mildly reduced LVEF, risk factors  Bladyn has no been seen in the [facility] office in over three years. In the years before that he saw Dr. Mathis Bud, Dr. Felton Clinton and Dr. Debara Pickett. He has been seen by a mid-level provider in the [place] system.  Has a history of moderate CAD (50-60% circumflex, 30% LAD by cath 2011). Reportedly there was no ostium for the RCA, which filled retrogradely as a branch of the LCX. EF was 45-50% by echo 2011, global hypokinesis. 2009 nuclear study showed apical ischemia, EF 53%.  He has mild obesity, HTN, mixed hyperlipidemia and insulin requiring DM (poorly controlled). Nonsmoker.  Recently developed dyspnea with minimal activity and occasional nonexertional chest pain that lasts for a few minutes. Rare dizziness and mild ankle swelling.  ECG shows NSR, left axis deviation, poor R wave progressioN, almost an LAFB pattern, no acute repol changes. Last Hgb A1c 8.4%. Had labs October 3 at [facility] - not available for review.     Allergies  Allergen Reactions  . Naprosyn [Naproxen] Hives    Current Outpatient Prescriptions  Medication Sig Dispense Refill  . aspirin 81 MG tablet Take 81 mg by mouth daily.      . carvedilol (COREG) 12.5 MG tablet Take 12.5 mg by mouth 2 (two) times daily with a meal.    . Cholecalciferol 1000 UNITS capsule Take 1,000 Units by mouth daily.    . fish oil-omega-3 fatty acids 1000 MG capsule Take 2 g by mouth daily.      . fluocinonide cream (LIDEX) 3.97 % Apply 1 application topically 2 (two) times daily.    Marland Kitchen gabapentin (NEURONTIN) 100 MG tablet Take 300 mg by mouth 3 (three) times daily.     Marland Kitchen gemfibrozil (LOPID) 600 MG tablet Take 600 mg by mouth 2 (two) times daily before a meal.    . hydrochlorothiazide (,MICROZIDE/HYDRODIURIL,) 12.5 MG capsule Take 25 mg by mouth daily.     Marland Kitchen HYDROcodone-acetaminophen (VICODIN) 5-500 MG per  tablet Take 1 tablet by mouth every 6 (six) hours as needed.      . insulin aspart protamine-insulin aspart (NOVOLOG 70/30) (70-30) 100 UNIT/ML injection Inject 95 Units into the skin 2 (two) times daily with a meal.      . lisinopril (PRINIVIL,ZESTRIL) 10 MG tablet Take 5 mg by mouth daily.    . metFORMIN (GLUCOPHAGE) 1000 MG tablet Take 1,000 mg by mouth 2 (two) times daily with a meal.      . omeprazole (PRILOSEC) 20 MG capsule 1 po every morning 31 capsule 5  . sildenafil (VIAGRA) 100 MG tablet Take 100 mg by mouth daily as needed for erectile dysfunction.    . traMADol (ULTRAM) 50 MG tablet Take 100 mg by mouth every 6 (six) hours as needed.    . TRAZODONE HCL PO Take 100 mg by mouth at bedtime.     Marland Kitchen venlafaxine (EFFEXOR) 75 MG tablet Take 75 mg by mouth 3 (three) times daily with meals.     No current facility-administered medications for this visit.    Past Medical History  Diagnosis Date  . Diabetes mellitus   . Hypertension   . Cellulitis of toe, right july 2012  . CAD (coronary artery disease) cath in 2011  . Cirrhosis of liver     due to Tylenol  . CAD in native artery  05/18/2014  . Hyperlipidemia 05/18/2014  . Nonischemic cardiomyopathy 05/18/2014    Past Surgical History  Procedure Laterality Date  . Cervical spine surgery    . Carpal tunnel release    . Esophagogastroduodenoscopy  02/07/2011    Procedure: ESOPHAGOGASTRODUODENOSCOPY (EGD);  Surgeon: Dorothyann Peng, MD;  [facility]: [facility];  Service: Endoscopy;  Laterality: N/A;  8:30AM  . Colonoscopy  FEB 2010 ARS NUR [place]    NL TCS    Family History  Problem Relation Age of Onset  . Colon cancer Neg Hx   . Liver disease Neg Hx     History   Social History  . Marital Status: Single    Spouse Name: N/A    Number of Children: N/A  . Years of Education: N/A   Occupational History  . Not on file.   Social History Main Topics  . Smoking status: Never Smoker   . Smokeless tobacco: Not on file  .  Alcohol Use: 1.8 oz/week    3 Cans of beer per week  . Drug Use: No     Comment: as a young teen  . Sexual Activity: Not on file   Other Topics Concern  . Not on file   Social History Narrative    Review of systems: As above. The patient specifically denies orthopnea, paroxysmal nocturnal dyspnea, syncope, palpitations, focal neurological deficits, intermittent claudication, lower extremity edema, unexplained weight gain, cough, hemoptysis or wheezing.  The patient also denies abdominal pain, nausea, vomiting, dysphagia, diarrhea, constipation, polyuria, polydipsia, dysuria, hematuria, frequency, urgency, abnormal bleeding or bruising, fever, chills, unexpected weight changes, mood swings, change in skin or hair texture, change in voice quality, auditory or visual problems, allergic reactions or rashes, new musculoskeletal complaints other than usual "aches and pains".   PHYSICAL EXAM BP 104/70 mmHg  Pulse 66  Resp 16  Ht 5\' 6"  (1.676 m)  Wt 203 lb 3.2 oz (92.171 kg)  BMI 32.81 kg/m2  General: Alert, oriented x3, no distress Head: no evidence of trauma, PERRL, EOMI, no exophtalmos or lid lag, no myxedema, no xanthelasma; normal ears, nose and oropharynx Neck: normal jugular venous pulsations and no hepatojugular reflux; brisk carotid pulses without delay and no carotid bruits Chest: clear to auscultation, no signs of consolidation by percussion or palpation, normal fremitus, symmetrical and full respiratory excursions Cardiovascular: normal position and quality of the apical impulse, regular rhythm, normal first and second heart sounds, no murmurs, rubs or gallops Abdomen: no tenderness or distention, no masses by palpation, no abnormal pulsatility or arterial bruits, normal bowel sounds, no hepatosplenomegaly Extremities: no clubbing, cyanosis or edema; 2+ radial, ulnar and brachial pulses bilaterally; 2+ right femoral, posterior tibial and dorsalis pedis pulses; 2+ left femoral,  posterior tibial and dorsalis pedis pulses; no subclavian or femoral bruits Neurological: grossly nonfocal   EKG: NSR, left axis, "almost" LAFB  Lipid Panel     Component Value Date/Time   CHOL 153 11/26/2010 0523   TRIG 252* 11/26/2010 0523   HDL 25* 11/26/2010 0523   CHOLHDL 6.1 11/26/2010 0523   VLDL 50* 11/26/2010 0523   LDLCALC 78 11/26/2010 0523    BMET    Component Value Date/Time   NA 142 11/30/2010 0456   K 3.7 11/30/2010 0456   CL 101 11/30/2010 0456   CO2 28 11/30/2010 0456   GLUCOSE 77 11/30/2010 0456   BUN 17 11/30/2010 0456   CREATININE 0.85 11/30/2010 0456   CALCIUM 9.5 11/30/2010 0456   GFRNONAA >60 11/30/2010  0456   GFRAA >60 11/30/2010 0456     ASSESSMENT AND PLAN  63 year old man with numerous and imperfectly controled risk factors presents with NYHA class II dyspnea (probably CHF) and atypical chest pain. Known moderate CAD and depressed LVEF. Lost to follow up for several years.  Reevaluate with echo and stress perfusion scan, may need coronary angio.  On appropriate ACEi and beta blocker, probably not much room for dose titration due to BP.  May need repeat coronary angio. Consider switching to loop diuretic if there is evidence of high filling pressures. Ranexa is an option for angina. Avoid nitrates while he takes sildenafil..  Get [facility]. Orders Placed This Encounter  Procedures  . Myocardial Perfusion Imaging  . EKG 12-Lead  . 2D Echocardiogram without contrast   Meds ordered this encounter  Medications  . carvedilol (COREG) 12.5 MG tablet    Sig: Take 12.5 mg by mouth 2 (two) times daily with a meal.  . Cholecalciferol 1000 UNITS capsule    Sig: Take 1,000 Units by mouth daily.  . fluocinonide cream (LIDEX) 0.05 %    Sig: Apply 1 application topically 2 (two) times daily.  Marland Kitchen gemfibrozil (LOPID) 600 MG tablet    Sig: Take 600 mg by mouth 2 (two) times daily before a meal.  . lisinopril (PRINIVIL,ZESTRIL) 10 MG tablet    Sig: Take  5 mg by mouth daily.  . sildenafil (VIAGRA) 100 MG tablet    Sig: Take 100 mg by mouth daily as needed for erectile dysfunction.  . traMADol (ULTRAM) 50 MG tablet    Sig: Take 100 mg by mouth every 6 (six) hours as needed.  . venlafaxine (EFFEXOR) 75 MG tablet    Sig: Take 75 mg by mouth 3 (three) times daily with meals.    Holli Humbles, MD, [place] [phone] office [phone] pager

## 2014-05-18 NOTE — Telephone Encounter (Signed)
Encounter complete. 

## 2014-05-25 ENCOUNTER — Ambulatory Visit (HOSPITAL_COMMUNITY)
Admission: RE | Admit: 2014-05-25 | Discharge: 2014-05-25 | Disposition: A | Discharge status: Home / Self Care | Payer: Medicare Other | Source: Ambulatory Visit | Attending: Cardiology | Admitting: Cardiology

## 2014-05-25 DIAGNOSIS — I059 Rheumatic mitral valve disease, unspecified: Secondary | ICD-10-CM

## 2014-05-25 DIAGNOSIS — R42 Dizziness and giddiness: Secondary | ICD-10-CM | POA: Insufficient documentation

## 2014-05-25 DIAGNOSIS — Z8249 Family history of ischemic heart disease and other diseases of the circulatory system: Secondary | ICD-10-CM | POA: Diagnosis not present

## 2014-05-25 DIAGNOSIS — R0609 Other forms of dyspnea: Secondary | ICD-10-CM | POA: Insufficient documentation

## 2014-05-25 DIAGNOSIS — R0600 Dyspnea, unspecified: Secondary | ICD-10-CM

## 2014-05-25 DIAGNOSIS — E119 Type 2 diabetes mellitus without complications: Secondary | ICD-10-CM | POA: Diagnosis not present

## 2014-05-25 DIAGNOSIS — E669 Obesity, unspecified: Secondary | ICD-10-CM | POA: Insufficient documentation

## 2014-05-25 DIAGNOSIS — R5383 Other fatigue: Secondary | ICD-10-CM | POA: Insufficient documentation

## 2014-05-25 DIAGNOSIS — R0789 Other chest pain: Secondary | ICD-10-CM

## 2014-05-25 DIAGNOSIS — R079 Chest pain, unspecified: Secondary | ICD-10-CM | POA: Insufficient documentation

## 2014-05-25 DIAGNOSIS — I10 Essential (primary) hypertension: Secondary | ICD-10-CM | POA: Diagnosis not present

## 2014-05-25 DIAGNOSIS — Z794 Long term (current) use of insulin: Secondary | ICD-10-CM | POA: Diagnosis not present

## 2014-05-25 MED ORDER — TECHNETIUM TC 99M SESTAMIBI GENERIC - CARDIOLITE
32.8000 | Freq: Once | INTRAVENOUS | Status: AC | PRN
  Administered 2014-05-25: 32.8 via INTRAVENOUS

## 2014-05-25 MED ORDER — TECHNETIUM TC 99M SESTAMIBI GENERIC - CARDIOLITE
10.3000 | Freq: Once | INTRAVENOUS | Status: AC | PRN
Start: 2014-05-25 — End: 2014-05-25
  Administered 2014-05-25: 10 via INTRAVENOUS

## 2014-05-25 MED ORDER — REGADENOSON 0.4 MG/5ML IV SOLN
0.4000 mg | Freq: Once | INTRAVENOUS | Status: AC
  Administered 2014-05-25: 0.4 mg via INTRAVENOUS

## 2014-05-25 NOTE — Procedures (Addendum)
[facility] [address] [place] [address] [phone]  Cardiology Nuclear Med Study  [name] [name] is a 63 y.o. male     MRN : [id number]     DOB: April 14, 1951  Procedure Date: 05/25/2014  Nuclear Med Background Indication for Stress Test:  Evaluation for Ischemia History:  CAD;No prior respiratory history reported;No prior NUC MPI for comparison. Cardiac Risk Factors: Family History - CAD, Hypertension, IDDM Type 2, Obesity and DVT  Symptoms:  Chest Pain, Dizziness, DOE, Fatigue and Light-Headedness   Nuclear Pre-Procedure Caffeine/Decaff Intake:  7:00pm NPO After: 5:00am   IV Site: R Forearm  IV 0.9% NS with Angio Cath:  22g  Chest Size (in):  46"  IV Started by: Rolene Course, RN  Height: 5\' 6"  (1.676 m)  Cup Size: n/a  BMI:  Body mass index is 32.78 kg/(m^2). Weight:  203 lb (92.08 kg)   Tech Comments:  n/a    Nuclear Med Study 1 or 2 day study: 1 day  Stress Test Type:  [name] Provider:  Sanda Klein, MD   Resting Radionuclide: Technetium 48m Sestamibi  Resting Radionuclide Dose: 10.3 mCi   Stress Radionuclide:  Technetium 74m Sestamibi  Stress Radionuclide Dose: 32.8 mCi           Stress Protocol Rest HR: 68 Stress HR: 74  Rest BP: 142/84 Stress BP: 142/67  Exercise Time (min): n/a METS: n/a   Predicted Max HR: 155 bpm % Max HR: 58.06 bpm Rate Pressure Product: 12960  Dose of Adenosine (mg):  n/a Dose of Lexiscan: 0.4 mg  Dose of Atropine (mg): n/a Dose of Dobutamine: n/a mcg/kg/min (at max HR)  Stress Test Technologist: Leane Para, CCT Nuclear Technologist: Imagene Riches, CNMT   Rest Procedure:  Myocardial perfusion imaging was performed at rest 45 minutes following the intravenous administration of Technetium 52m Sestamibi. Stress Procedure:  The patient received IV Lexiscan 0.4 mg over 15-seconds.  Technetium 44m Sestamibi injected IV at 30-seconds.  There were no  significant changes with Lexiscan.  Quantitative spect images were obtained after a 45 minute delay.  Transient Ischemic Dilatation (Normal <1.22):  1.20  QGS EDV:  126 ml QGS ESV:  72 ml LV Ejection Fraction: 43%       Rest ECG: NSR - Normal EKG  Stress ECG: No significant change from baseline ECG  QPS Raw Data Images:  Normal; no motion artifact; normal heart/lung ratio. Stress Images:  There is decreased uptake in the apex. Rest Images:  There is decreased uptake in the apex. Subtraction (SDS):  There is no evidence of scar or ischemia.  Impression Exercise Capacity:  Lexiscan with no exercise. BP Response:  Normal blood pressure response. Clinical Symptoms:  No significant symptoms noted. ECG Impression:  No significant ST segment change suggestive of ischemia. Comparison with Prior Nuclear Study: No images to compare  Overall Impression:  Low risk stress nuclear study Apical thinning.  LV Wall Motion:  Mild to moderate global LV dysfunction   [name], MD  05/25/2014 1:20 PM

## 2014-05-25 NOTE — Progress Notes (Signed)
2D Echo Performed 05/25/2014    [name] [name], RCS Definity used on patient. Last BP was 111/78

## 2014-05-26 ENCOUNTER — Telehealth: Payer: Self-pay | Admitting: Cardiovascular Disease

## 2014-05-26 NOTE — Telephone Encounter (Signed)
Closed encounter °

## 2014-07-02 ENCOUNTER — Encounter: Payer: Self-pay | Admitting: Cardiovascular Disease

## 2014-07-02 ENCOUNTER — Ambulatory Visit (INDEPENDENT_AMBULATORY_CARE_PROVIDER_SITE_OTHER): Payer: PPO | Admitting: Cardiovascular Disease

## 2014-07-02 VITALS — BP 120/78 | HR 74 | Ht 66.0 in | Wt 208.9 lb

## 2014-07-02 DIAGNOSIS — I2510 Atherosclerotic heart disease of native coronary artery without angina pectoris: Secondary | ICD-10-CM

## 2014-07-02 DIAGNOSIS — I428 Other cardiomyopathies: Secondary | ICD-10-CM

## 2014-07-02 DIAGNOSIS — R0602 Shortness of breath: Secondary | ICD-10-CM

## 2014-07-02 DIAGNOSIS — E785 Hyperlipidemia, unspecified: Secondary | ICD-10-CM

## 2014-07-02 DIAGNOSIS — Z79899 Other long term (current) drug therapy: Secondary | ICD-10-CM

## 2014-07-02 DIAGNOSIS — I5032 Chronic diastolic (congestive) heart failure: Secondary | ICD-10-CM

## 2014-07-02 DIAGNOSIS — I429 Cardiomyopathy, unspecified: Secondary | ICD-10-CM

## 2014-07-02 MED ORDER — LISINOPRIL 20 MG PO TABS: 20.0000 mg | ORAL_TABLET | Freq: Every day | ORAL | Status: DC

## 2014-07-02 MED ORDER — FUROSEMIDE 40 MG PO TABS: 40.0000 mg | ORAL_TABLET | Freq: Every day | ORAL | Status: DC

## 2014-07-02 NOTE — Patient Instructions (Signed)
STOP HCTZ.  START Furosemide 40mg  daily - WAIT 4-5 days after stopping HCTZ.  INCREASE Lisinopril to 20mg  daily.  Your physician recommends that you return for lab work in: Several days before your next appointment.  Dr. Sallyanne Kuster recommends that you schedule a follow-up appointment in: 4-6 weeks.

## 2014-07-02 NOTE — Progress Notes (Signed)
Patient ID: [name] [name], male   DOB: [birth year], 64 y.o.   MRN: [id number]      Reason for office visit Follow-up echo and nuclear stress test  [name] [name] returns in follow-up after undergoing an echocardiogram and a stress my card perfusion study. No perfusion abnormalities were seen. By echo his LVEF was 50%, by nuclear QGS it was 43%. Years ago when he underwent cardiac catheterization his left ventricular ejection fraction was 45-50%. Was evidence of diastolic dysfunction, but no clear evidence of elevated filling pressure at rest. He complains of pain in his left hip that is worse when he rests and improves when he walks. He does not have intermittent claudication symptoms.  Has a history of moderate CAD (50-60% circumflex, 30% LAD by cath 2011). Reportedly there was no ostium for the RCA, which filled retrogradely as a branch of the LCX. EF was 45-50% by echo 2011, global hypokinesis. 2009 nuclear study showed apical ischemia, EF 53%. He has mild obesity, HTN, mixed hyperlipidemia and insulin requiring DM (poorly controlled). Nonsmoker. Recently developed dyspnea with minimal activity and occasional nonexertional chest pain that lasts for a few minutes. Rare dizziness and mild ankle swelling. ECG shows NSR, left axis deviation, poor R wave progressioN, almost an LAFB pattern, no acute repol changes. Last Hgb A1c 8.4%. Had labs October 3 at [name] - not available for review.    Allergies  Allergen Reactions  . Naprosyn [Naproxen] Hives    Current Outpatient Prescriptions  Medication Sig Dispense Refill  . aspirin 81 MG tablet Take 81 mg by mouth daily.      . carvedilol (COREG) 12.5 MG tablet Take 12.5 mg by mouth 2 (two) times daily with a meal.    . Cholecalciferol 1000 UNITS capsule Take 1,000 Units by mouth daily.    . fish oil-omega-3 fatty acids 1000 MG capsule Take 2 g by mouth daily.      . fluocinonide cream (LIDEX) 3.75 % Apply 1 application topically 2 (two) times daily.    Marland Kitchen  gabapentin (NEURONTIN) 100 MG tablet Take 300 mg by mouth 3 (three) times daily.     . insulin aspart protamine-insulin aspart (NOVOLOG 70/30) (70-30) 100 UNIT/ML injection Inject 95 Units into the skin 2 (two) times daily with a meal.      . metFORMIN (GLUCOPHAGE) 1000 MG tablet Take 1,000 mg by mouth 2 (two) times daily with a meal.      . omeprazole (PRILOSEC) 20 MG capsule 1 po every morning 31 capsule 5  . sildenafil (VIAGRA) 100 MG tablet Take 100 mg by mouth daily as needed for erectile dysfunction.    . TRAZODONE HCL PO Take 100 mg by mouth at bedtime.     Marland Kitchen venlafaxine (EFFEXOR) 75 MG tablet Take 75 mg by mouth 3 (three) times daily with meals.    . furosemide (LASIX) 40 MG tablet Take 1 tablet (40 mg total) by mouth daily. 90 tablet 3  . lisinopril (PRINIVIL,ZESTRIL) 20 MG tablet Take 1 tablet (20 mg total) by mouth daily. 90 tablet 3   No current facility-administered medications for this visit.    Past Medical History  Diagnosis Date  . Diabetes mellitus   . Hypertension   . Cellulitis of toe, right july 2012  . CAD (coronary artery disease) cath in 2011  . Cirrhosis of liver     due to Tylenol  . CAD in native artery 05/18/2014  . Hyperlipidemia 05/18/2014  . Nonischemic cardiomyopathy 05/18/2014  .  Chronic diastolic heart failure 93/90/3009    Past Surgical History  Procedure Laterality Date  . Cervical spine surgery    . Carpal tunnel release    . Esophagogastroduodenoscopy  02/07/2011    Procedure: ESOPHAGOGASTRODUODENOSCOPY (EGD);  Surgeon: [name] Peng, MD;  Location: [name];  Service: Endoscopy;  Laterality: [name];  8:30AM  . Colonoscopy  FEB 2010 ARS NUR [place]    NL TCS    Family History  Problem Relation Age of Onset  . Colon cancer Neg Hx   . Liver disease Neg Hx     History   Social History  . Marital Status: Single    Spouse Name: [name] [name]    Number of Children: [name] [name]  . Years of Education: [name] [name]   Occupational History  . Not on file.   Social  History Main Topics  . Smoking status: Never Smoker   . Smokeless tobacco: Not on file  . Alcohol Use: 1.8 oz/week    3 Cans of beer per week  . Drug Use: No     Comment: as a young teen  . Sexual Activity: Not on file   Other Topics Concern  . Not on file   Social History Narrative    Review of systems: As above. The patient specifically denies orthopnea, paroxysmal nocturnal dyspnea, syncope, palpitations, focal neurological deficits, intermittent claudication, lower extremity edema, unexplained weight gain, cough, hemoptysis or wheezing.  The patient also denies abdominal pain, nausea, vomiting, dysphagia, diarrhea, constipation, polyuria, polydipsia, dysuria, hematuria, frequency, urgency, abnormal bleeding or bruising, fever, chills, unexpected weight changes, mood swings, change in skin or hair texture, change in voice quality, auditory or visual problems, allergic reactions or rashes, new musculoskeletal complaints other than usual "aches and pains".  PHYSICAL EXAM BP 120/78 mmHg  Pulse 74  Ht 5\' 6"  (1.676 m)  Wt 94.756 kg (208 lb 14.4 oz)  BMI 33.73 kg/m2 General: Alert, oriented x3, no distress Head: no evidence of trauma, PERRL, EOMI, no exophtalmos or lid lag, no myxedema, no xanthelasma; normal ears, nose and oropharynx Neck: normal jugular venous pulsations and no hepatojugular reflux; brisk carotid pulses without delay and no carotid bruits Chest: clear to auscultation, no signs of consolidation by percussion or palpation, normal fremitus, symmetrical and full respiratory excursions Cardiovascular: normal position and quality of the apical impulse, regular rhythm, normal first and second heart sounds, no murmurs, rubs or gallops Abdomen: no tenderness or distention, no masses by palpation, no abnormal pulsatility or arterial bruits, normal bowel sounds, no hepatosplenomegaly Extremities: no clubbing, cyanosis or edema; 2+ radial, ulnar and brachial pulses bilaterally;  2+ right femoral, posterior tibial and dorsalis pedis pulses; 2+ left femoral, posterior tibial and dorsalis pedis pulses; no subclavian or femoral bruits Neurological: grossly nonfocal   EKG: NSR, left axis deviation  Lipid Panel     Component Value Date/Time   CHOL 153 11/26/2010 0523   TRIG 252* 11/26/2010 0523   HDL 25* 11/26/2010 0523   CHOLHDL 6.1 11/26/2010 0523   VLDL 50* 11/26/2010 0523   LDLCALC 78 11/26/2010 0523    BMET    Component Value Date/Time   NA 142 11/30/2010 0456   K 3.7 11/30/2010 0456   CL 101 11/30/2010 0456   CO2 28 11/30/2010 0456   GLUCOSE 77 11/30/2010 0456   BUN 17 11/30/2010 0456   CREATININE 0.85 11/30/2010 0456   CALCIUM 9.5 11/30/2010 0456   GFRNONAA >60 11/30/2010 0456   GFRAA >60 11/30/2010 0456  ASSESSMENT AND PLAN  I think his recent symptoms are related to diastolic heart failure. He does have mildly depressed left ventricular systolic function. Increase lisinopril to 20 mg daily. Switch from hydrochlorothiazide to low-dose furosemide. Consider further increasing lisinopril and carvedilol as tolerated by blood pressure and symptoms.  Spent a long time discussing the importance of sodium restriction, educating about nutritional information on food products, daily weight monitoring, signs and symptoms of heart failure exacerbation.  At this point there is no compelling reason to proceed with heart catheterization. This might be an option in the future for symptoms that do not respond to medical therapy or for accelerated angina.  Risk factor of education for CAD discussed. He has a reasonably low LDL cholesterol level, but has high triglycerides related to obesity and metabolic syndrome. Weight loss and daily exercise discussed.  Orders Placed This Encounter  Procedures  . Basic metabolic panel  . Brain natriuretic peptide   Meds ordered this encounter  Medications  . lisinopril (PRINIVIL,ZESTRIL) 20 MG tablet    Sig: Take 1  tablet (20 mg total) by mouth daily.    Dispense:  90 tablet    Refill:  3  . furosemide (LASIX) 40 MG tablet    Sig: Take 1 tablet (40 mg total) by mouth daily.    Dispense:  90 tablet    Refill:  [place] Petina Muraski, MD, [name] [phone] office [phone] pager

## 2014-08-13 ENCOUNTER — Ambulatory Visit: Payer: PPO | Admitting: Cardiovascular Disease

## 2014-09-23 ENCOUNTER — Ambulatory Visit: Payer: PPO | Admitting: Cardiovascular Disease

## 2014-10-18 ENCOUNTER — Encounter: Payer: Self-pay | Admitting: Cardiovascular Disease

## 2014-11-12 ENCOUNTER — Ambulatory Visit: Payer: PPO | Admitting: Cardiovascular Disease

## 2014-11-18 ENCOUNTER — Encounter: Payer: Self-pay | Admitting: Cardiovascular Disease

## 2014-11-18 ENCOUNTER — Ambulatory Visit (INDEPENDENT_AMBULATORY_CARE_PROVIDER_SITE_OTHER): Payer: PPO | Admitting: Cardiovascular Disease

## 2014-11-18 VITALS — BP 112/72 | HR 88 | Resp 16 | Ht 66.0 in | Wt 204.8 lb

## 2014-11-18 DIAGNOSIS — I429 Cardiomyopathy, unspecified: Secondary | ICD-10-CM | POA: Diagnosis not present

## 2014-11-18 DIAGNOSIS — I2510 Atherosclerotic heart disease of native coronary artery without angina pectoris: Secondary | ICD-10-CM

## 2014-11-18 DIAGNOSIS — I428 Other cardiomyopathies: Secondary | ICD-10-CM

## 2014-11-18 NOTE — Progress Notes (Signed)
Patient ID: [name] [name], male   DOB: 05/29/1950, 64 y.o.   MRN: [id number]     Cardiology Office Note   Date:  11/20/2014   ID:  DOWELL HOON, DOB 02-16-1951, MRN [id number]  PCP:  Delphina Cahill, MD  Cardiologist:   Sanda Klein, MD   Chief Complaint  Patient presents with  . Follow-up    6 months:  No chest pain or edema.  SOB w/walking up an incline.  Occas. lightheadedness.      History of Present Illness: [name] [name] is a 64 y.o. male who presents for  Follow-up for mild coronary artery disease and superimposed nonischemic cardiomyopathy with left ventricular ejection fraction of 45-50 percent. He has not had overt congestive heart failure despite the fact that he does not receive diuretics. He is on moderate to high doses of carvedilol and lisinopril. He has not required hospitalization for congestive heart failure. He is fairly disciplined and sodium restriction. He denies angina pectoris. He develops dyspnea only when walking uphill. He has not had palpitations or syncope and denies edema or intermittent claudication or focal neurological events.  Has a history of moderate CAD (50-60% circumflex, 30% LAD by cath 2011). Reportedly there was no ostium for the RCA, which filled retrogradely as a branch of the LCX. EF was 45-50% by echo 2011, global hypokinesis. 2009 nuclear study showed apical ischemia, EF 53%. Echo in December 2015 showed no change in EF and confirmed diastolic dysfunction without clear evidence for elevated filling pressure. Nuclear study performed on same visit showed normal perfusion , EF 43% He has mild obesity, HTN, mixed hyperlipidemia and insulin requiring DM (poorly controlled). Nonsmoker.    Past Medical History  Diagnosis Date  . Diabetes mellitus   . Hypertension   . Cellulitis of toe, right july 2012  . CAD (coronary artery disease) cath in 2011  . Cirrhosis of liver     due to Tylenol  . CAD in native artery 05/18/2014  . Hyperlipidemia 05/18/2014    . Nonischemic cardiomyopathy 05/18/2014  . Chronic diastolic heart failure 63/89/3734    Past Surgical History  Procedure Laterality Date  . Cervical spine surgery    . Carpal tunnel release    . Esophagogastroduodenoscopy  02/07/2011    Procedure: ESOPHAGOGASTRODUODENOSCOPY (EGD);  Surgeon: Dorothyann Peng, MD;  Location: [facility];  Service: Endoscopy;  Laterality: N/A;  8:30AM  . Colonoscopy  FEB 2010 ARS NUR [place]    NL TCS  . Doppler echocardiography  05/09/2010    EF 45-50%  . Nm myoview ltd  01/08/2008    EF 53%  Low risk scan  . Cardiac catheterization  05/24/2010  . Carotid duplex  05/23/2010  . Lower ext atrerial duplex  05/23/2010  . Sleep study  01/01/2008     Current Outpatient Prescriptions  Medication Sig Dispense Refill  . aspirin 81 MG tablet Take 81 mg by mouth daily.      Marland Kitchen atorvastatin (LIPITOR) 20 MG tablet Take 20 mg by mouth daily.    . carvedilol (COREG) 12.5 MG tablet Take 12.5 mg by mouth 2 (two) times daily with a meal.    . Cholecalciferol 1000 UNITS capsule Take 1,000 Units by mouth daily.    . fish oil-omega-3 fatty acids 1000 MG capsule Take 2 g by mouth daily.      . fluocinonide cream (LIDEX) 2.87 % Apply 1 application topically 2 (two) times daily.    Marland Kitchen gabapentin (NEURONTIN) 100 MG tablet  Take 400 mg by mouth 2 (two) times daily.     . insulin aspart protamine-insulin aspart (NOVOLOG 70/30) (70-30) 100 UNIT/ML injection Inject 65 Units into the skin 2 (two) times daily with a meal. 65u am 40u in the pm    . lisinopril (PRINIVIL,ZESTRIL) 20 MG tablet Take 1 tablet (20 mg total) by mouth daily. 90 tablet 3  . metFORMIN (GLUCOPHAGE) 1000 MG tablet Take 1,000 mg by mouth 2 (two) times daily with a meal.      . sildenafil (VIAGRA) 100 MG tablet Take 100 mg by mouth daily as needed for erectile dysfunction.    . TRAZODONE HCL PO Take 100 mg by mouth at bedtime.     Marland Kitchen venlafaxine (EFFEXOR) 75 MG tablet Take 75 mg by mouth 3 (three) times daily with  meals.     No current facility-administered medications for this visit.    Allergies:   Naprosyn    Social History:  The patient  reports that he has never smoked. He does not have any smokeless tobacco history on file. He reports that he drinks about 1.8 oz of alcohol per week. He reports that he does not use illicit drugs.   Family History:  The patient's family history is negative for Colon cancer and Liver disease.    ROS:  Please see the history of present illness.    Otherwise, review of systems positive for none.   All other systems are reviewed and negative.    PHYSICAL EXAM: VS:  BP 112/72 mmHg  Pulse 88  Ht 5\' 6"  (1.676 m)  Wt 204 lb 12.8 oz (92.897 kg)  BMI 33.07 kg/m2 , BMI Body mass index is 33.07 kg/(m^2).  General: Alert, oriented x3, no distress Head: no evidence of trauma, PERRL, EOMI, no exophtalmos or lid lag, no myxedema, no xanthelasma; normal ears, nose and oropharynx Neck: normal jugular venous pulsations and no hepatojugular reflux; brisk carotid pulses without delay and no carotid bruits Chest: clear to auscultation, no signs of consolidation by percussion or palpation, normal fremitus, symmetrical and full respiratory excursions Cardiovascular: normal position and quality of the apical impulse, regular rhythm, normal first and second heart sounds, no murmurs, rubs or gallops Abdomen: no tenderness or distention, no masses by palpation, no abnormal pulsatility or arterial bruits, normal bowel sounds, no hepatosplenomegaly Extremities: no clubbing, cyanosis or edema; 2+ radial, ulnar and brachial pulses bilaterally; 2+ right femoral, posterior tibial and dorsalis pedis pulses; 2+ left femoral, posterior tibial and dorsalis pedis pulses; no subclavian or femoral bruits Neurological: grossly nonfocal Psych: euthymic mood, full affect   EKG:  EKG is ordered today. The ekg ordered today demonstrates  Sinus rhythm, delayed R-wave transition in lead V5, QTC 423  ms   Recent Labs: No results found for requested labs within last 365 days.    Lipid Panel    Component Value Date/Time   CHOL 153 11/26/2010 0523   TRIG 252* 11/26/2010 0523   HDL 25* 11/26/2010 0523   CHOLHDL 6.1 11/26/2010 0523   VLDL 50* 11/26/2010 0523   LDLCALC 78 11/26/2010 0523     Reports recent labs performed at the [facility] , not available for review  Wt Readings from Last 3 Encounters:  11/18/14 204 lb 12.8 oz (92.897 kg)  07/02/14 208 lb 14.4 oz (94.756 kg)  05/25/14 203 lb (92.08 kg)      ASSESSMENT AND PLAN:  1. Chronic diastolic heart failure , currently appears euvolemic, functional class II. A a few  months ago I prescribed furosemide but it seems he is not taking this any longer. He appears well compensated nonetheless.  2. Coronary artery disease without angina pectoris  3. Systemic hypertension well controlled  4. Diabetes mellitus and hyperlipidemia, labs performed through the [facility] are not available for review.  Weight loss, calorie restriction the special by avoiding sugars and starches , regular exercise are again reviewed.  Asked him to get [place] a copy of his labs from the [facility]   Current medicines are reviewed at length with the patient today.  The patient does not have concerns regarding medicines.  The following changes have been made:  no change  Labs/ tests ordered today include:  Orders Placed This Encounter  Procedures  . EKG 12-Lead    Patient Instructions  Your physician wants you to follow-up in: 1 year with Dr. Sallyanne Kuster. You will receive a reminder letter in the mail two months in advance. If you don't receive a letter, please call our office to schedule the follow-up appointment.      Mikael Spray, MD  11/20/2014 5:04 PM    Sanda Klein, MD, [facility] [phone] office [phone] pager

## 2014-11-18 NOTE — Patient Instructions (Signed)
Your physician wants you to follow-up in: 1 year with Dr. Croitoru. You will receive a reminder letter in the mail two months in advance. If you don't receive a letter, please call our office to schedule the follow-up appointment.  

## 2014-11-20 ENCOUNTER — Encounter: Payer: Self-pay | Admitting: Cardiovascular Disease

## 2014-11-22 ENCOUNTER — Telehealth: Payer: Self-pay | Admitting: *Deleted

## 2014-11-22 NOTE — Telephone Encounter (Signed)
-----   Message from Sanda Klein, MD sent at 11/20/2014  5:11 PM EDT ----- Please remind him I want a copy of labs from [place]

## 2014-11-22 NOTE — Telephone Encounter (Signed)
Called and reminded patient to get a copy of labs (lipid profile) from the [place].  Patient is awaiting a call from the [place] and will fax results to our office as soon as he gets them.

## 2015-03-31 ENCOUNTER — Other Ambulatory Visit: Payer: Self-pay

## 2015-03-31 ENCOUNTER — Telehealth: Payer: Self-pay

## 2015-03-31 MED ORDER — LISINOPRIL 20 MG PO TABS: 20.0000 mg | ORAL_TABLET | Freq: Every day | ORAL | Status: DC

## 2015-03-31 NOTE — Telephone Encounter (Deleted)
Okay to refill Lasix? Was supposed to be [place] 11/18/14, does not show up on current med list either.

## 2015-03-31 NOTE — Telephone Encounter (Signed)
Okay to refill Lasix?  Was supposed to be [place] 11/18/14, does not show up on current med list either.

## 2015-04-01 NOTE — Telephone Encounter (Signed)
I spoke with patient and he does not know if he has been taking Lasix since June.  I told him we would not be refilling but if he finds that he has been taking it to call us back.

## 2015-04-01 NOTE — Telephone Encounter (Signed)
Please ask patient if he is taking this. I don't think so. Do not refill if he is not taking it. MCr

## 2015-06-27 DIAGNOSIS — E119 Type 2 diabetes mellitus without complications: Secondary | ICD-10-CM | POA: Diagnosis not present

## 2015-06-27 DIAGNOSIS — E782 Mixed hyperlipidemia: Secondary | ICD-10-CM | POA: Diagnosis not present

## 2015-06-27 DIAGNOSIS — K591 Functional diarrhea: Secondary | ICD-10-CM | POA: Diagnosis not present

## 2015-06-27 DIAGNOSIS — I10 Essential (primary) hypertension: Secondary | ICD-10-CM | POA: Diagnosis not present

## 2015-06-28 ENCOUNTER — Encounter: Payer: Self-pay | Admitting: Gastroenterology

## 2015-07-14 ENCOUNTER — Ambulatory Visit (INDEPENDENT_AMBULATORY_CARE_PROVIDER_SITE_OTHER): Payer: PPO | Admitting: Gastroenterology

## 2015-07-14 ENCOUNTER — Encounter: Payer: Self-pay | Admitting: Gastroenterology

## 2015-07-14 ENCOUNTER — Other Ambulatory Visit: Payer: Self-pay

## 2015-07-14 VITALS — BP 125/68 | HR 66 | Temp 96.8°F | Ht 66.0 in | Wt 212.8 lb

## 2015-07-14 DIAGNOSIS — Z1211 Encounter for screening for malignant neoplasm of colon: Secondary | ICD-10-CM

## 2015-07-14 DIAGNOSIS — R195 Other fecal abnormalities: Secondary | ICD-10-CM | POA: Diagnosis not present

## 2015-07-14 DIAGNOSIS — K7460 Unspecified cirrhosis of liver: Secondary | ICD-10-CM | POA: Diagnosis not present

## 2015-07-14 DIAGNOSIS — R194 Change in bowel habit: Secondary | ICD-10-CM

## 2015-07-14 LAB — PROTIME-INR
INR: 0.95 (ref <1.50)
Prothrombin Time: 12.8 seconds (ref 11.6–15.2)

## 2015-07-14 LAB — COMPLETE METABOLIC PANEL WITH GFR
ALT: 19 U/L (ref 9–46)
AST: 22 U/L (ref 10–35)
Albumin: 4 g/dL (ref 3.6–5.1)
Alkaline Phosphatase: 84 U/L (ref 40–115)
BILIRUBIN TOTAL: 0.4 mg/dL (ref 0.2–1.2)
BUN: 20 mg/dL (ref 7–25)
CO2: 29 mmol/L (ref 20–31)
CREATININE: 1.27 mg/dL — AB (ref 0.70–1.25)
Calcium: 9.2 mg/dL (ref 8.6–10.3)
Chloride: 101 mmol/L (ref 98–110)
GFR, EST NON AFRICAN AMERICAN: 58 mL/min — AB (ref >=60)
GFR, Est African American: 68 mL/min (ref >=60)
GLUCOSE: 170 mg/dL — AB (ref 65–99)
Potassium: 4.4 mmol/L (ref 3.5–5.3)
SODIUM: 136 mmol/L (ref 135–146)
TOTAL PROTEIN: 6.8 g/dL (ref 6.1–8.1)

## 2015-07-14 LAB — CBC
HCT: 32.9 % — ABNORMAL LOW (ref 39.0–52.0)
HEMOGLOBIN: 10.9 g/dL — AB (ref 13.0–17.0)
MCH: 33.3 pg (ref 26.0–34.0)
MCHC: 33.1 g/dL (ref 30.0–36.0)
MCV: 100.6 fL — AB (ref 78.0–100.0)
MPV: 9.4 fL (ref 8.6–12.4)
PLATELETS: 189 10*3/uL (ref 150–400)
RBC: 3.27 MIL/uL — AB (ref 4.22–5.81)
RDW: 14.8 % (ref 11.5–15.5)
WBC: 8 10*3/uL (ref 4.0–10.5)

## 2015-07-14 LAB — TSH: TSH: 3.76 mIU/L (ref 0.40–4.50)

## 2015-07-14 MED ORDER — NA SULFATE-K SULFATE-MG SULF 17.5-3.13-1.6 GM/177ML PO SOLN: 1.0000 | ORAL | Status: DC

## 2015-07-14 NOTE — Patient Instructions (Signed)
We have scheduled you for a colonoscopy and upper endoscopy with Dr. Oneida Alar.  Please complete the stool studies and blood work. We have ordered an ultrasound of your liver for routine purposes.  Please take the vaccination prescription to your primary care doctor to have started.

## 2015-07-14 NOTE — Progress Notes (Signed)
cc'ed to pcp °

## 2015-07-14 NOTE — Progress Notes (Signed)
Cc'ed to pcp °

## 2015-07-14 NOTE — Assessment & Plan Note (Signed)
6 month history of loose stool, no abdominal pain. Last colonoscopy in 2010. Foul odor noted. Check stool studies to include Cdiff PCR now. As his last colonoscopy was 7 years ago, will also update now due to change in bowel habits. May benefit from [place] depending on Child Pugh score; will obtain labs and decide if this is appropriate for him. I anticipate it will be, as he appears well-compensated. Check stool studies first.  Proceed with colonoscopy with Dr. Oneida Alar in the near future. The risks, benefits, and alternatives have been discussed in detail with the patient. They state understanding and desire to proceed.  Phenergan 12.5 mg IV on call  Stool studies ordered Consider Viberzi

## 2015-07-14 NOTE — Progress Notes (Signed)
Primary Care Physician:  Wende Neighbors, MD Primary Gastroenterologist:  Dr. Oneida Alar   Chief Complaint  Patient presents with  . Diarrhea    HPI:   [name] [name] is a 65 y.o. male presenting today at the request of his PCP secondary to diarrhea. We have not seen him since 2012. He has a history of cirrhosis, incidentally found on CT imaging in 2012. He is not uptodate on labs or imaging. EGD in 2012 with H.pylori gastritis and portal gastropathy. Needs EGD for screening. Last colonoscopy in 2010 at [facility] normal.   States he has had diarrhea for over 6 months, has foul odor. No changes in medication.  Thought maybe he had parasites at one time. Was given bottles to get samples completed at the [place]. A few months ago. Notes 3-5 loose stools per day, stating first stool of the morning has a little better consistency, but then after this is runny. Notes at the bottom of his rectum, he believes he has a hemorrhoid. Diarrhea splatters on side of toilet bowl. No rectal bleeding. No weight loss. No lack of appetite. No abdominal pain. No recent antibiotic exposure. Well water. No N/V, dysphagia. No severe reflux symptoms.   Past Medical History  Diagnosis Date  . Diabetes mellitus   . Hypertension   . Cellulitis of toe, right july 2012  . CAD (coronary artery disease) cath in 2011  . Cirrhosis of liver ([place])     ? ETOH, fatty liver   . CAD in native artery 05/18/2014  . Hyperlipidemia 05/18/2014  . Nonischemic cardiomyopathy ([place]) 05/18/2014  . Chronic diastolic heart failure ([place]) 05/18/2014    Past Surgical History  Procedure Laterality Date  . Cervical spine surgery    . Carpal tunnel release    . Esophagogastroduodenoscopy  02/07/2011    Dr. Oneida Alar: H.pylori gastritis, mild portal gastropathy, treatmetn with Amoxicillin/Biaxin   . Colonoscopy  FEB 2010 ARS NUR [place]    NL TCS  . Doppler echocardiography  05/09/2010    EF 45-50%  . Nm myoview ltd  01/08/2008    EF 53%  Low risk scan  .  Cardiac catheterization  05/24/2010  . Carotid duplex  05/23/2010  . Lower ext atrerial duplex  05/23/2010  . Sleep study  01/01/2008    Current Outpatient Prescriptions  Medication Sig Dispense Refill  . aspirin 81 MG tablet Take 81 mg by mouth daily.      . carvedilol (COREG) 12.5 MG tablet Take 12.5 mg by mouth 2 (two) times daily with a meal.    . Cholecalciferol 1000 UNITS capsule Take 1,000 Units by mouth daily.    . fish oil-omega-3 fatty acids 1000 MG capsule Take 2 g by mouth daily.      . fluocinonide cream (LIDEX) [id number] % Apply 1 application topically 2 (two) times daily.    Marland Kitchen gabapentin (NEURONTIN) 100 MG tablet Take 400 mg by mouth 2 (two) times daily.     . insulin aspart protamine-insulin aspart (NOVOLOG 70/30) (70-30) 100 UNIT/ML injection Inject 65 Units into the skin 2 (two) times daily with a meal. 65u am 40u in the pm    . lisinopril (PRINIVIL,ZESTRIL) 20 MG tablet Take 1 tablet (20 mg total) by mouth daily. 90 tablet 2  . sildenafil (VIAGRA) 100 MG tablet Take 100 mg by mouth daily as needed for erectile dysfunction.    . TRAZODONE HCL PO Take 100 mg by mouth at bedtime.     Marland Kitchen venlafaxine (  EFFEXOR) 75 MG tablet Take 75 mg by mouth 3 (three) times daily with meals.     No current facility-administered medications for this visit.    Allergies as of 07/14/2015 - Review Complete 07/14/2015  Allergen Reaction Noted  . Naprosyn [naproxen] Hives 11/30/2010    Family History  Problem Relation Age of Onset  . Colon cancer Neg Hx   . Liver disease Neg Hx     Social History   Social History  . Marital Status: Single    Spouse Name: N/A  . Number of Children: N/A  . Years of Education: N/A   Occupational History  . Not on file.   Social History Main Topics  . Smoking status: Never Smoker   . Smokeless tobacco: Not on file  . Alcohol Use: 1.8 oz/week    3 Cans of beer per week     Comment: very rare alcohol use   . Drug Use: No     Comment: as a young teen  .  Sexual Activity: Not on file   Other Topics Concern  . Not on file   Social History Narrative    Review of Systems: Gen: Denies any fever, chills, fatigue, weight loss, lack of appetite.  CV: Denies chest pain, heart palpitations, peripheral edema, syncope.  Resp: Denies shortness of breath at rest or with exertion. Denies wheezing or cough.  GI: see HPI  GU : Denies urinary burning, urinary frequency, urinary hesitancy MS: +knee problems, stiffness  Derm: Denies rash, itching, dry skin Psych: Denies depression, anxiety, memory loss, and confusion Heme: Denies bruising, bleeding, and enlarged lymph nodes.  Physical Exam: BP 125/68 mmHg  Pulse 66  Temp(Src) 96.8 F (36 C)  Ht 5\' 6"  (1.676 m)  Wt 212 lb 12.8 oz (96.525 kg)  BMI 34.36 kg/m2 General:   Alert and oriented. Pleasant and cooperative. Well-nourished and well-developed.  Head:  Normocephalic and atraumatic. Eyes:  Without icterus, sclera clear and conjunctiva pink.  Ears:  Normal auditory acuity. Nose:  No deformity, discharge,  or lesions. Mouth:  No deformity or lesions, oral mucosa pink.  Lungs:  Clear to auscultation bilaterally. No wheezes, rales, or rhonchi. No distress.  Heart:  S1, S2 present without murmurs appreciated.  Abdomen:  +BS, soft, obese. Liver margin palpable below right costal margin.  Rectal:  Deferred  Msk:  Symmetrical without gross deformities. Normal posture. Extremities:  Without  edema. Neurologic:  Alert and  oriented x4;  grossly normal neurologically. Psych:  Alert and cooperative. Normal mood and affect.

## 2015-07-14 NOTE — Assessment & Plan Note (Signed)
65 year old male with history of cirrhosis incidentally noted in 2012 on imaging, lost to follow-up. Query etiology secondary to NASH/ETOH. He has a beer every few weeks but not consistently. Needs further work-up of serologies including Hep C antibody. Well-compensated at this time. No varices on EGD in 2012 but portal gastropathy and H.pylori gastritis noted. Due for screening EGD now and imaging for [facility].  Proceed with upper endoscopy in the near future with Dr. Oneida Alar. The risks, benefits, and alternatives have been discussed in detail with patient. They have stated understanding and desire to proceed.  Phenergan 12.5 mg IV on call  us abdomen now Labs as ordered Hep A/B vaccinations recommended, prescription provided

## 2015-07-15 LAB — MITOCHONDRIAL ANTIBODIES: Mitochondrial M2 Ab, IgG: <=20 Units (ref <=20.0)

## 2015-07-15 LAB — ANTI-SMOOTH MUSCLE ANTIBODY, IGG

## 2015-07-15 LAB — HEPATITIS C ANTIBODY: HCV Ab: NEGATIVE

## 2015-07-15 LAB — ANA: ANA: NEGATIVE

## 2015-07-18 LAB — CERULOPLASMIN: CERULOPLASMIN: 24 mg/dL (ref 18–36)

## 2015-07-19 ENCOUNTER — Ambulatory Visit (HOSPITAL_COMMUNITY)
Admission: RE | Admit: 2015-07-19 | Discharge: 2015-07-19 | Disposition: A | Discharge status: Home / Self Care | Payer: PPO | Source: Ambulatory Visit | Attending: Gastroenterology | Admitting: Gastroenterology

## 2015-07-19 DIAGNOSIS — K760 Fatty (change of) liver, not elsewhere classified: Secondary | ICD-10-CM | POA: Diagnosis not present

## 2015-07-19 DIAGNOSIS — K7460 Unspecified cirrhosis of liver: Secondary | ICD-10-CM | POA: Diagnosis not present

## 2015-07-21 ENCOUNTER — Other Ambulatory Visit (HOSPITAL_COMMUNITY): Payer: PPO

## 2015-07-26 ENCOUNTER — Other Ambulatory Visit: Payer: Self-pay

## 2015-07-26 DIAGNOSIS — D582 Other hemoglobinopathies: Secondary | ICD-10-CM

## 2015-07-26 NOTE — Progress Notes (Signed)
Quick Note:  Glad the diarrhea has resolved! It was likely medication induced. No need for Viberzi. ______

## 2015-07-26 NOTE — Progress Notes (Signed)
Quick Note:  Slight bump in Cr to 1.27. TSH normal. Hgb 10.9, need to check iron, ferritin and TIBC. He is already scheduled for TCS/EGD.  He is Child Pugh Class A. I had asked for stool studies to be completed. Has he done this? IF THEY ARE NEGATIVE, could consider Viberzi 75 mg po BID as he is Child-Pugh A.  Please copy labs to his PCP, as I do not know what his baseline creatinine is. This needs further follow-up. Please make sure my note is attached (result note). ______

## 2015-07-26 NOTE — Progress Notes (Signed)
CC'D TO PCP °

## 2015-07-26 NOTE — Progress Notes (Signed)
Quick Note:  Pt is aware of results. He will keep appt for the TCS/EGD.  He said he has been taken off of Metformin and no longer has diarrhea, stools almost normal. He is aware we will send labs to PCP for him to follow up on his Creatinine. Lab orders have been entered for the iron, ferritin and TIBC. He will go to [facility] for these. Routing to [place] to send info to PCP . ______

## 2015-08-01 ENCOUNTER — Encounter (HOSPITAL_COMMUNITY): Payer: Self-pay | Admitting: *Deleted

## 2015-08-01 ENCOUNTER — Ambulatory Visit (HOSPITAL_COMMUNITY)
Admission: RE | Admit: 2015-08-01 | Discharge: 2015-08-01 | Disposition: A | Discharge status: Home / Self Care | Payer: PPO | Source: Ambulatory Visit | Attending: Gastroenterology | Admitting: Gastroenterology

## 2015-08-01 ENCOUNTER — Encounter (HOSPITAL_COMMUNITY)
Admission: RE | Disposition: A | Discharge status: Home / Self Care | Payer: Self-pay | Source: Ambulatory Visit | Attending: Gastroenterology

## 2015-08-01 DIAGNOSIS — K210 Gastro-esophageal reflux disease with esophagitis: Secondary | ICD-10-CM | POA: Diagnosis not present

## 2015-08-01 DIAGNOSIS — K2950 Unspecified chronic gastritis without bleeding: Secondary | ICD-10-CM | POA: Diagnosis not present

## 2015-08-01 DIAGNOSIS — K7460 Unspecified cirrhosis of liver: Secondary | ICD-10-CM | POA: Diagnosis not present

## 2015-08-01 DIAGNOSIS — E785 Hyperlipidemia, unspecified: Secondary | ICD-10-CM | POA: Diagnosis not present

## 2015-08-01 DIAGNOSIS — Z1211 Encounter for screening for malignant neoplasm of colon: Secondary | ICD-10-CM | POA: Diagnosis not present

## 2015-08-01 DIAGNOSIS — R194 Change in bowel habit: Secondary | ICD-10-CM

## 2015-08-01 DIAGNOSIS — E119 Type 2 diabetes mellitus without complications: Secondary | ICD-10-CM | POA: Insufficient documentation

## 2015-08-01 DIAGNOSIS — K648 Other hemorrhoids: Secondary | ICD-10-CM | POA: Diagnosis not present

## 2015-08-01 DIAGNOSIS — K2970 Gastritis, unspecified, without bleeding: Secondary | ICD-10-CM

## 2015-08-01 DIAGNOSIS — Q438 Other specified congenital malformations of intestine: Secondary | ICD-10-CM | POA: Diagnosis not present

## 2015-08-01 DIAGNOSIS — I110 Hypertensive heart disease with heart failure: Secondary | ICD-10-CM | POA: Diagnosis not present

## 2015-08-01 DIAGNOSIS — I2510 Atherosclerotic heart disease of native coronary artery without angina pectoris: Secondary | ICD-10-CM | POA: Diagnosis not present

## 2015-08-01 DIAGNOSIS — I5030 Unspecified diastolic (congestive) heart failure: Secondary | ICD-10-CM | POA: Insufficient documentation

## 2015-08-01 HISTORY — PX: ESOPHAGOGASTRODUODENOSCOPY: SHX5428

## 2015-08-01 HISTORY — PX: COLONOSCOPY: SHX5424

## 2015-08-01 LAB — GLUCOSE, CAPILLARY: Glucose-Capillary: 242 mg/dL — ABNORMAL HIGH (ref 65–99)

## 2015-08-01 SURGERY — COLONOSCOPY
Anesthesia: Moderate Sedation

## 2015-08-01 MED ORDER — OMEPRAZOLE 20 MG PO CPDR: DELAYED_RELEASE_CAPSULE | ORAL | Status: DC

## 2015-08-01 MED ORDER — SODIUM CHLORIDE 0.9 % IV SOLN
INTRAVENOUS | Status: DC
  Administered 2015-08-01: 14:00:00 via INTRAVENOUS

## 2015-08-01 MED ORDER — PROMETHAZINE HCL 25 MG/ML IJ SOLN
INTRAMUSCULAR | Status: DC | PRN
  Administered 2015-08-01: 12.5 mg via INTRAVENOUS

## 2015-08-01 MED ORDER — LIDOCAINE VISCOUS 2 % MT SOLN
OROMUCOSAL | Status: AC
  Filled 2015-08-01: qty 15

## 2015-08-01 MED ORDER — STERILE WATER FOR IRRIGATION IR SOLN
Status: DC | PRN
  Administered 2015-08-01: 2.5 mL

## 2015-08-01 MED ORDER — PROMETHAZINE HCL 25 MG/ML IJ SOLN
12.5000 mg | Freq: Once | INTRAMUSCULAR | Status: AC
  Administered 2015-08-01: 12.5 mg via INTRAVENOUS

## 2015-08-01 MED ORDER — SODIUM CHLORIDE 0.9% FLUSH
INTRAVENOUS | Status: DC
Start: 2015-08-01 — End: 2015-08-01
  Filled 2015-08-01: qty 10

## 2015-08-01 MED ORDER — MIDAZOLAM HCL 5 MG/5ML IJ SOLN
INTRAMUSCULAR | Status: AC
  Filled 2015-08-01: qty 10

## 2015-08-01 MED ORDER — MIDAZOLAM HCL 5 MG/5ML IJ SOLN
INTRAMUSCULAR | Status: DC | PRN
  Administered 2015-08-01: 2 mg via INTRAVENOUS
  Administered 2015-08-01: 1 mg via INTRAVENOUS

## 2015-08-01 MED ORDER — MEPERIDINE HCL 100 MG/ML IJ SOLN
INTRAMUSCULAR | Status: DC | PRN
  Administered 2015-08-01: 25 mg via INTRAVENOUS
  Administered 2015-08-01: 50 mg via INTRAVENOUS

## 2015-08-01 MED ORDER — PROMETHAZINE HCL 25 MG/ML IJ SOLN
INTRAMUSCULAR | Status: AC
  Filled 2015-08-01: qty 1

## 2015-08-01 MED ORDER — LIDOCAINE VISCOUS 2 % MT SOLN
OROMUCOSAL | Status: DC | PRN
  Administered 2015-08-01: 1 via OROMUCOSAL

## 2015-08-01 MED ORDER — MEPERIDINE HCL 100 MG/ML IJ SOLN
INTRAMUSCULAR | Status: AC
  Filled 2015-08-01: qty 2

## 2015-08-01 NOTE — H&P (Signed)
Primary Care Physician:  Wende Neighbors, MD Primary Gastroenterologist:  Dr. Oneida Alar  Pre-Procedure History & Physical: HPI:  [name] [name] is a 65 y.o. male here for SCREENING: COLON CA/VARICES.  Past Medical History  Diagnosis Date  . Diabetes mellitus   . Hypertension   . Cellulitis of toe, right july 2012  . CAD (coronary artery disease) cath in 2011  . Cirrhosis of liver ([place])     ? ETOH, fatty liver   . CAD in native artery 05/18/2014  . Hyperlipidemia 05/18/2014  . Nonischemic cardiomyopathy ([place]) 05/18/2014  . Chronic diastolic heart failure ([place]) 05/18/2014   Past Surgical History  Procedure Laterality Date  . Cervical spine surgery    . Carpal tunnel release    . Esophagogastroduodenoscopy  02/07/2011    Dr. Oneida Alar: H.pylori gastritis, mild portal gastropathy, treatmetn with Amoxicillin/Biaxin   . Colonoscopy  FEB 2010 ARS NUR [place]    NL TCS  . Doppler echocardiography  05/09/2010    EF 45-50%  . Nm myoview ltd  01/08/2008    EF 53%  Low risk scan  . Cardiac catheterization  05/24/2010  . Carotid duplex  05/23/2010  . Lower ext atrerial duplex  05/23/2010  . Sleep study  01/01/2008   Prior to Admission medications   Medication Sig Start Date End Date Taking? Authorizing Provider  aspirin 81 MG tablet Take 81 mg by mouth daily.     Yes Historical Provider, MD  carvedilol (COREG) 12.5 MG tablet Take 12.5 mg by mouth 2 (two) times daily with a meal.   Yes Historical Provider, MD  fluocinonide cream (LIDEX) 6.28 % Apply 1 application topically 2 (two) times daily.   Yes Historical Provider, MD  gabapentin (NEURONTIN) 100 MG tablet Take 400 mg by mouth 2 (two) times daily.    Yes Historical Provider, MD  insulin aspart protamine-insulin aspart (NOVOLOG 70/30) (70-30) 100 UNIT/ML injection Inject 40-65 Units into the skin 2 (two) times daily with a meal. 40 units in the morning and 65 units in the evening.   Yes Historical Provider, MD  lisinopril (PRINIVIL,ZESTRIL) 20 MG tablet  Take 1 tablet (20 mg total) by mouth daily. 03/31/15  Yes Mihai Croitoru, MD  Na Sulfate-K Sulfate-Mg Sulf (SUPREP BOWEL PREP) SOLN Take 1 kit by mouth as directed. 07/14/15  Yes Danie Binder, MD  sildenafil (VIAGRA) 100 MG tablet Take 100 mg by mouth daily as needed for erectile dysfunction.   Yes Historical Provider, MD  traZODone (DESYREL) 100 MG tablet Take 100 mg by mouth at bedtime.   Yes Historical Provider, MD  venlafaxine (EFFEXOR) 75 MG tablet Take 75 mg by mouth 3 (three) times daily with meals.   Yes Historical Provider, MD  Cholecalciferol 1000 UNITS capsule Take 1,000 Units by mouth daily.    Historical Provider, MD  fish oil-omega-3 fatty acids 1000 MG capsule Take 2 g by mouth daily.      Historical Provider, MD    Allergies as of 07/14/2015 - Review Complete 07/14/2015  Allergen Reaction Noted  . Naprosyn [naproxen] Hives 11/30/2010    Family History  Problem Relation Age of Onset  . Colon cancer Neg Hx   . Liver disease Neg Hx     Social History   Social History  . Marital Status: Single    Spouse Name: N/A  . Number of Children: N/A  . Years of Education: N/A   Occupational History  . Not on file.   Social History Main Topics  .  Smoking status: Never Smoker   . Smokeless tobacco: Not on file  . Alcohol Use: 1.8 oz/week    3 Cans of beer per week     Comment: very rare alcohol use   . Drug Use: No     Comment: as a young teen  . Sexual Activity: Not on file   Other Topics Concern  . Not on file   Social History Narrative    Review of Systems: See HPI, otherwise negative ROS   Physical Exam: BP 120/75 mmHg  Pulse 62  Temp(Src) 97.8 F (36.6 C) (Oral)  Resp 14  Ht _0  (1.676 m)  Wt 212 lb (96.163 kg)  BMI 34.23 kg/m2  SpO2 99% General:   Alert,  pleasant and cooperative in NAD Head:  Normocephalic and atraumatic. Neck:  Supple; Lungs:  Clear throughout to auscultation.    Heart:  Regular rate and rhythm. Abdomen:  Soft, nontender and  nondistended. Normal bowel sounds, without guarding, and without rebound.   Neurologic:  Alert and  oriented x4;  grossly normal neurologically.  Impression/Plan:     SCREENING FOR COLON CA AND VARICES  PLAN:  1.EGD/TCS TODAY

## 2015-08-01 NOTE — Op Note (Signed)
[facility] [address] [place], [address]   COLONOSCOPY PROCEDURE REPORT  PATIENT: [name] [name]  MR#: [id number] BIRTHDATE: 1950/11/09 , 8  yrs. old GENDER: male ENDOSCOPIST: Danie Binder, MD REFERRED [id number] Hall, M.D. PROCEDURE DATE:  2015/08/29 PROCEDURE:   Colonoscopy, screening INDICATIONS:average risk patient for colon cancer. MEDICATIONS: Promethazine (Phenergan) 25 mg IV, Demerol 50 mg IV, and Versed 2 mg IV MD STARTED SEDATOION: 1428 PROCEDURE COMPLETE: 1504  DESCRIPTION OF PROCEDURE:    Physical exam was performed.  Informed consent was obtained from the patient after explaining the benefits, risks, and alternatives to procedure.  The patient was connected to monitor and placed in left lateral position. Continuous oxygen was provided by nasal cannula and IV medicine administered through an indwelling cannula.  After administration of sedation and rectal exam, the patients rectum was intubated and the [id number]  colonoscope was advanced under direct visualization to the cecum.  The scope was removed slowly by carefully examining the color, texture, anatomy, and integrity mucosa on the way out.  The patient was recovered in endoscopy and discharged home in satisfactory condition. Estimated blood loss is zero unless otherwise noted in this procedure report.    COLON FINDINGS: The colon was redundant.  OTHERWISE, The colonic mucosa appeared normal throughout the entire examined colon.  , and Small internal hemorrhoids were found.  PREP QUALITY: good.CECAL W/D TIME: 12       minutes COMPLICATIONS: None  ENDOSCOPIC IMPRESSION: 1.   The left colon IS redundant 2.   Small internal hemorrhoids  RECOMMENDATIONS: DRINK WATER TO KEEP URINE LIGHT YELLOW. FOLLOW A HIGH FIBER DIET. AVOID ITEMS THAT TRIGGER HEARTBURN AND INDIGESTION. Use Prilosec 30 minutes prior to your first meal TO PREVENT ULCERS FROM ASPIRIN USE AND TO TREAT  ESOPHAGITIS. AWAIT BIOPSY RESULTS. FOLLOW UP IN JUL 2017. NEXT UPPER ENDOSCOPY IN 3 YEARS. Next colonoscopy in 10 years.   eSigned:  Danie Binder, MD 08-29-15 3:14 PM  CPT CODES: ICD CODES:  The ICD and CPT codes recommended by this software are interpretations from the data that the clinical staff has captured with the software.  The verification of the translation of this report to the ICD and CPT codes and modifiers is the sole responsibility of the health care institution and practicing physician where this report was generated.  [place] [facility]. will not be held responsible for the validity of the ICD and CPT codes included on this report.  AMA assumes no liability for data contained or not contained herein. CPT is a Designer, television/film set of the [employer].

## 2015-08-01 NOTE — Progress Notes (Signed)
REVIEWED-NO ADDITIONAL RECOMMENDATIONS. 

## 2015-08-01 NOTE — Discharge Instructions (Signed)
You DID NOT HAVE POLYPS.  You HAVE internal AND EXTERNAL hemorrhoids. You have acid reflux causing esophagitis and gastritis due to ASPIRIN AND ALCOHOL.  I biopsied your stomach.   DRINK WATER TO KEEP YOUR URINE LIGHT YELLOW.  FOLLOW A HIGH FIBER DIET. AVOID ITEMS THAT CAUSE BLOATING. SEE INFO BELOW.  AVOID ITEMS THAT TRIGGER HEARTBURN AND INDIGESTION.  Use Prilosec 30 minutes prior to your first meal TO PREVENT ULCERS FROM ASPIRIN USE AND TO TREAT  ESOPHAGITIS.  YOUR BIOPSY RESULTS WILL BE AVAILABLE IN MY CHART MAR 9 AND MY OFFICE WILL CONTACT YOU IN 10-14 DAYS WITH YOUR RESULTS.   FOLLOW UP IN  JUL 2017.   NEXT UPPER ENDOSCOPY IN 3 YEARS.  Next colonoscopy in 10 years.   ENDOSCOPY Care After Read the instructions outlined below and refer to this sheet in the next week. These discharge instructions provide you with general information on caring for yourself after you leave the hospital. While your treatment has been planned according to the most current medical practices available, unavoidable complications occasionally occur. If you have any problems or questions after discharge, call DR. Tyashia Morrisette, [phone].  ACTIVITY  You may resume your regular activity, but move at a slower pace for the next 24 hours.   Take frequent rest periods for the next 24 hours.   Walking will help get rid of the air and reduce the bloated feeling in your belly (abdomen).   No driving for 24 hours (because of the medicine (anesthesia) used during the test).   You may shower.   Do not sign any important legal documents or operate any machinery for 24 hours (because of the anesthesia used during the test).    NUTRITION  Drink plenty of fluids.   You may resume your normal diet as instructed by your doctor.   Begin with a light meal and progress to your normal diet. Heavy or fried foods are harder to digest and may make you feel sick to your stomach (nauseated).   Avoid alcoholic beverages  for 24 hours or as instructed.    MEDICATIONS  You may resume your normal medications.   WHAT YOU CAN EXPECT TODAY  Some feelings of bloating in the abdomen.   Passage of more gas than usual.   Spotting of blood in your stool or on the toilet paper  .  IF YOU HAD POLYPS REMOVED DURING THE ENDOSCOPY:  Eat a soft diet IF YOU HAVE NAUSEA, BLOATING, ABDOMINAL PAIN, OR VOMITING.    FINDING OUT THE RESULTS OF YOUR TEST Not all test results are available during your visit. DR. Oneida Alar WILL CALL YOU WITHIN 14 DAYS OF YOUR PROCEDUE WITH YOUR RESULTS. Do not assume everything is normal if you have not heard from DR. Hanalei Glace, CALL HER OFFICE AT [phone].  SEEK IMMEDIATE MEDICAL ATTENTION AND CALL THE OFFICE: [phone] IF:  You have more than a spotting of blood in your stool.   Your belly is swollen (abdominal distention).   You are nauseated or vomiting.   You have a temperature over 101F.   You have abdominal pain or discomfort that is severe or gets worse throughout the day.   Gastritis  Gastritis is an inflammation (the body's way of reacting to injury and/or infection) of the stomach. It is often caused by viral or bacterial (germ) infections. It can also be caused BY ALCOHOL, ASPIRIN, BC/GOODY POWDER'S, (IBUPROFEN) MOTRIN, OR ALEVE (NAPROXEN), chemicals (including alcohol), SPICY FOODS, and medications. This illness may be associated  with generalized malaise (feeling tired, not well), UPPER ABDOMINAL STOMACH cramps, and fever. One common bacterial cause of gastritis is an organism known as H. Pylori. This can be treated with antibiotics.    High-Fiber Diet A high-fiber diet changes your normal diet to include more whole grains, legumes, fruits, and vegetables. Changes in the diet involve replacing refined carbohydrates with unrefined foods. The calorie level of the diet is essentially unchanged. The Dietary Reference Intake (recommended amount) for adult males is 38 grams  per day. For adult females, it is 25 grams per day. Pregnant and lactating women should consume 28 grams of fiber per day. Fiber is the intact part of a plant that is not broken down during digestion. Functional fiber is fiber that has been isolated from the plant to provide a beneficial effect in the body. PURPOSE  Increase stool bulk.   Ease and regulate bowel movements.   Lower cholesterol.  REDUCE RISK OF COLON CANCER  INDICATIONS THAT YOU NEED MORE FIBER  Constipation and hemorrhoids.   Uncomplicated diverticulosis (intestine condition) and irritable bowel syndrome.   Weight management.   As a protective measure against hardening of the arteries (atherosclerosis), diabetes, and cancer.   GUIDELINES FOR INCREASING FIBER IN THE DIET  Start adding fiber to the diet slowly. A gradual increase of about 5 more grams (2 slices of whole-wheat bread, 2 servings of most fruits or vegetables, or 1 bowl of high-fiber cereal) per day is best. Too rapid an increase in fiber may result in constipation, flatulence, and bloating.   Drink enough water and fluids to keep your urine clear or pale yellow. Water, juice, or caffeine-free drinks are recommended. Not drinking enough fluid may cause constipation.   Eat a variety of high-fiber foods rather than one type of fiber.   Try to increase your intake of fiber through using high-fiber foods rather than fiber pills or supplements that contain small amounts of fiber.   The goal is to change the types of food eaten. Do not supplement your present diet with high-fiber foods, but replace foods in your present diet.  INCLUDE A VARIETY OF FIBER SOURCES  Replace refined and processed grains with whole grains, canned fruits with fresh fruits, and incorporate other fiber sources. White rice, white breads, and most bakery goods contain little or no fiber.   Brown whole-grain rice, buckwheat oats, and many fruits and vegetables are all good sources of  fiber. These include: broccoli, Brussels sprouts, cabbage, cauliflower, beets, sweet potatoes, white potatoes (skin on), carrots, tomatoes, eggplant, squash, berries, fresh fruits, and dried fruits.   Cereals appear to be the richest source of fiber. Cereal fiber is found in whole grains and bran. Bran is the fiber-rich outer coat of cereal grain, which is largely removed in refining. In whole-grain cereals, the bran remains. In breakfast cereals, the largest amount of fiber is found in those with "bran" in their names. The fiber content is sometimes indicated on the label.   You may need to include additional fruits and vegetables each day.   In baking, for 1 cup white flour, you may use the following substitutions:   1 cup whole-wheat flour minus 2 tablespoons.   1/2 cup white flour plus 1/2 cup whole-wheat flour.   Hemorrhoids Hemorrhoids are dilated (enlarged) veins around the rectum. Sometimes clots will form in the veins. This makes them swollen and painful. These are called thrombosed hemorrhoids. Causes of hemorrhoids include:  Constipation.   Straining to have a bowel  movement.   HEAVY LIFTING HOME CARE INSTRUCTIONS  Eat a well balanced diet and drink 6 to 8 glasses of water every day to avoid constipation. You may also use a bulk laxative.   Avoid straining to have bowel movements.   Keep anal area dry and clean.   Do not use a donut shaped pillow or sit on the toilet for long periods. This increases blood pooling and pain.   Move your bowels when your body has the urge; this will require less straining and will decrease pain and pressure.

## 2015-08-01 NOTE — Op Note (Signed)
[facility] [address] [place], [address]   ENDOSCOPY PROCEDURE REPORT  PATIENT: [name] [name], [name] [name]  MR#: [id number] BIRTHDATE: Sep 27, 1950 , 54  yrs. old GENDER: male  ENDOSCOPIST: Danie Binder, MD REFERRED [id number] Hall, M.D.  PROCEDURE DATE: 08-02-2015 PROCEDURE:   EGD w/ biopsy  INDICATIONS:screening for varices. MEDICATIONS: TCS + Versed 1 mg IV and Demerol 25 mg IV MD STARTED SEDATION: 1428. PROCEDURE COMPLETE: 1504 TOPICAL ANESTHETIC:   Viscous Xylocaine ASA CLASS:  DESCRIPTION OF PROCEDURE:     Physical exam was performed.  Informed consent was obtained from the patient after explaining the benefits, risks, and alternatives to the procedure.  The patient was connected to the monitor and placed in the left lateral position.  Continuous oxygen was provided by nasal cannula and IV medicine administered through an indwelling cannula.  After administration of sedation, the patients esophagus was intubated and the [id number])  endoscope was advanced under direct visualization to the second portion of the duodenum.  The scope was removed slowly by carefully examining the color, texture, anatomy, and integrity of the mucosa on the way out.  The patient was recovered in endoscopy and discharged home in satisfactory condition.  Estimated blood loss is zero unless otherwise noted in this procedure report.   ESOPHAGUS: FEW LINEAR EROSIONS IN DISTAL ESOPHAGUS.   STOMACH: Mild non-erosive gastritis (inflammation) was found in the gastric body and gastric antrum.  Multiple biopsies were performed using cold forceps.   DUODENUM: The duodenal mucosa showed no abnormalities in the bulb and 2nd part of the duodenum. COMPLICATIONS: There were no immediate complications.  ENDOSCOPIC IMPRESSION: 1.   Reflux esophagitis 2.   MILD gastritis  RECOMMENDATIONS: DRINK WATER TO KEEP URINE LIGHT YELLOW. FOLLOW A HIGH FIBER DIET. AVOID ITEMS THAT TRIGGER HEARTBURN  AND INDIGESTION. Use Prilosec 30 minutes prior to your first meal TO PREVENT ULCERS FROM ASPIRIN USE AND TO TREAT ESOPHAGITIS. AWAIT BIOPSY RESULTS. FOLLOW UP IN JUL 2017. NEXT UPPER ENDOSCOPY IN 3 YEARS. Next colonoscopy in 10 years.  REPEAT EXAM:  eSigned:  Danie Binder, MD 2015/08/02 3:18 PM  CPT CODES: ICD CODES:  The ICD and CPT codes recommended by this software are interpretations from the data that the clinical staff has captured with the software.  The verification of the translation of this report to the ICD and CPT codes and modifiers is the sole responsibility of the health care institution and practicing physician where this report was generated.  [facility]. will not be held responsible for the validity of the ICD and CPT codes included on this report.  AMA assumes no liability for data contained or not contained herein. CPT is a Designer, television/film set of the [employer].

## 2015-08-09 ENCOUNTER — Encounter (HOSPITAL_COMMUNITY): Payer: Self-pay | Admitting: Gastroenterology

## 2015-09-08 ENCOUNTER — Telehealth: Payer: Self-pay | Admitting: Gastroenterology

## 2015-09-08 NOTE — Telephone Encounter (Signed)
OV made and reminder in epic °

## 2015-09-08 NOTE — Telephone Encounter (Signed)
Please call pt. His stomach Bx shows  gastritis.  You have acid reflux causing esophagitis and gastritis due to ASPIRIN AND ALCOHOL.    DRINK WATER TO KEEP YOUR URINE LIGHT YELLOW.  FOLLOW A HIGH FIBER DIET. AVOID ITEMS THAT CAUSE BLOATING.   AVOID ITEMS THAT TRIGGER HEARTBURN AND INDIGESTION.  Use Prilosec 30 minutes prior to your first meal TO PREVENT ULCERS FROM ASPIRIN USE AND TO TREAT  ESOPHAGITIS.  FOLLOW UP IN  JUL 2017 E30 GASTRITIS/GERD.   NEXT UPPER ENDOSCOPY IN 3 YEARS.

## 2015-09-08 NOTE — Telephone Encounter (Signed)
Tried to call with no answer  

## 2015-09-08 NOTE — Telephone Encounter (Signed)
Pt is aware of results. 

## 2015-12-05 ENCOUNTER — Ambulatory Visit: Payer: PPO | Admitting: Gastroenterology

## 2015-12-19 DIAGNOSIS — E782 Mixed hyperlipidemia: Secondary | ICD-10-CM | POA: Diagnosis not present

## 2015-12-19 DIAGNOSIS — Z205 Contact with and (suspected) exposure to viral hepatitis: Secondary | ICD-10-CM | POA: Diagnosis not present

## 2015-12-19 DIAGNOSIS — E119 Type 2 diabetes mellitus without complications: Secondary | ICD-10-CM | POA: Diagnosis not present

## 2015-12-27 DIAGNOSIS — E782 Mixed hyperlipidemia: Secondary | ICD-10-CM | POA: Diagnosis not present

## 2015-12-27 DIAGNOSIS — N183 Chronic kidney disease, stage 3 (moderate): Secondary | ICD-10-CM | POA: Diagnosis not present

## 2015-12-27 DIAGNOSIS — I10 Essential (primary) hypertension: Secondary | ICD-10-CM | POA: Diagnosis not present

## 2015-12-27 DIAGNOSIS — E119 Type 2 diabetes mellitus without complications: Secondary | ICD-10-CM | POA: Diagnosis not present

## 2016-01-10 ENCOUNTER — Other Ambulatory Visit: Payer: Self-pay

## 2016-01-10 ENCOUNTER — Encounter: Payer: Self-pay | Admitting: Gastroenterology

## 2016-01-10 ENCOUNTER — Ambulatory Visit (INDEPENDENT_AMBULATORY_CARE_PROVIDER_SITE_OTHER): Payer: PPO | Admitting: Gastroenterology

## 2016-01-10 VITALS — BP 111/67 | HR 69 | Temp 97.8°F | Ht 66.0 in | Wt 190.6 lb

## 2016-01-10 DIAGNOSIS — K7030 Alcoholic cirrhosis of liver without ascites: Secondary | ICD-10-CM

## 2016-01-10 DIAGNOSIS — D6489 Other specified anemias: Secondary | ICD-10-CM

## 2016-01-10 LAB — FERRITIN: Ferritin: 535 ng/mL — ABNORMAL HIGH (ref 20–380)

## 2016-01-10 MED ORDER — PANTOPRAZOLE SODIUM 40 MG PO TBEC: 40.0000 mg | DELAYED_RELEASE_TABLET | Freq: Every day | ORAL | 3 refills | Status: DC

## 2016-01-10 NOTE — Patient Instructions (Addendum)
Start taking Protonix once each morning, 30 minutes before breakfast. I sent this to the pharmacy.   Please have blood work done today.  We have scheduled you for an ultrasound of your liver, which we will do every 6 months.  We will see you back in 6 months!

## 2016-01-10 NOTE — Progress Notes (Signed)
Referring Provider: Celene Squibb, MD Primary Care Physician:  Wende Neighbors, MD  Primary GI: Dr. Oneida Alar   Chief Complaint  Patient presents with  . Follow-up    loose stool    HPI:   [name] [name] is a 65 y.o. 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 Recently underwent EGD/TCS. Findings of reflux esophagitis, mild gastritis, next EGD in 3 years. Small internal hemorrhoids. Next TCS in 10 years. Need to check Iron, ferritin, TIBC. US abdomen due now for [facility] screening. Hep A/B vaccinations: still needed.   Drinks a glass of wine every once in awhile. 1-2 beers every few weeks. No abdominal pain. [place] stool scale #6, about twice a day. Diarrhea improved after stopping metformin. No mental status changes, no confusion.   Past Medical History:  Diagnosis Date  . CAD (coronary artery disease) cath in 2011  . CAD in native artery 05/18/2014  . Cellulitis of toe, right july 2012  . Chronic diastolic heart failure ([place]) 05/18/2014  . Cirrhosis of liver ([place])    ? ETOH, fatty liver   . Diabetes mellitus   . Hyperlipidemia 05/18/2014  . Hypertension   . Nonischemic cardiomyopathy ([place]) 05/18/2014    Past Surgical History:  Procedure Laterality Date  . CARDIAC CATHETERIZATION  05/24/2010  . Carotid duplex  05/23/2010  . CARPAL TUNNEL RELEASE    . CERVICAL SPINE SURGERY    . COLONOSCOPY  FEB 2010 ARS NUR [place]   NL TCS  . COLONOSCOPY N/A 08/01/2015   Dr. Oneida Alar: small internal hemorrhoids, next TCS in 10 years   . DOPPLER ECHOCARDIOGRAPHY  05/09/2010   EF 45-50%  . ESOPHAGOGASTRODUODENOSCOPY  02/07/2011   Dr. Oneida Alar: H.pylori gastritis, mild portal gastropathy, treatmetn with Amoxicillin/Biaxin   . ESOPHAGOGASTRODUODENOSCOPY N/A 08/01/2015   Dr. Oneida Alar: Reflux esophagitis, mild gastritis, next EGD in 3 years   . lower ext atrerial duplex  05/23/2010  . NM MYOVIEW LTD  01/08/2008   EF 53%  Low risk scan  . sleep study  01/01/2008    Current Outpatient  Prescriptions  Medication Sig Dispense Refill  . aspirin 81 MG tablet Take 81 mg by mouth daily.      . carvedilol (COREG) 12.5 MG tablet Take 12.5 mg by mouth 2 (two) times daily with a meal.    . fish oil-omega-3 fatty acids 1000 MG capsule Take 2 g by mouth daily.      . fluocinonide cream (LIDEX) [id number] % Apply 1 application topically 2 (two) times daily.    Marland Kitchen gabapentin (NEURONTIN) 100 MG tablet Take 400 mg by mouth 2 (two) times daily.     Marland Kitchen LEVEMIR FLEXTOUCH 100 UNIT/ML Pen Inject 46 Units into the skin daily.    Marland Kitchen lisinopril (PRINIVIL,ZESTRIL) 20 MG tablet Take 1 tablet (20 mg total) by mouth daily. 90 tablet 2  . sildenafil (VIAGRA) 100 MG tablet Take 100 mg by mouth daily as needed for erectile dysfunction.    . traZODone (DESYREL) 100 MG tablet Take 100 mg by mouth at bedtime.    Marland Kitchen venlafaxine (EFFEXOR) 75 MG tablet Take 75 mg by mouth 3 (three) times daily with meals.    . pantoprazole (PROTONIX) 40 MG tablet Take 1 tablet (40 mg total) by mouth daily. Take 30 minutes before breakfast 90 tablet 3   No current facility-administered medications for this visit.     Allergies as of 01/10/2016 - Review Complete 01/10/2016  Allergen Reaction Noted  .  Naprosyn [naproxen] Hives 11/30/2010    Family History  Problem Relation Age of Onset  . Colon cancer Neg Hx   . Liver disease Neg Hx     Social History   Social History  . Marital status: Single    Spouse name: N/A  . Number of children: N/A  . Years of education: N/A   Social History Main Topics  . Smoking status: Never Smoker  . Smokeless tobacco: None  . Alcohol use 1.8 oz/week    3 Cans of beer per week     Comment: very rare alcohol use   . Drug use: No     Comment: as a young teen  . Sexual activity: Not Asked   Other Topics Concern  . None   Social History Narrative  . None    Review of Systems: As mentioned in HPI   Physical Exam: BP 111/67   Pulse 69   Temp 97.8 F (36.6 C) (Oral)   Ht 5\' 6"   (1.676 m)   Wt 190 lb 9.6 oz (86.5 kg)   BMI 30.76 kg/m  General:   Alert and oriented. No distress noted. Pleasant and cooperative.  Head:  Normocephalic and atraumatic. Eyes:  Conjuctiva clear without scleral icterus. Mouth:  Oral mucosa pink and moist.  Heart:  S1, S2 present without murmurs, rubs, or gallops. Regular rate and rhythm. Abdomen:  +BS, soft, non-tender and non-distended. Liver margin palpable below right costal margin Extremities:  Without edema. Neurologic:  Alert and  oriented x4;  grossly normal neurologically. Psych:  Alert and cooperative. Normal mood and affect.  Lab Results  Component Value Date   WBC 8.0 07/14/2015   HGB 10.9 (L) 07/14/2015   HCT 32.9 (L) 07/14/2015   MCV 100.6 (H) 07/14/2015   PLT 189 07/14/2015

## 2016-01-11 LAB — IRON AND TIBC
%SAT: 26 % (ref 15–60)
Iron: 116 ug/dL (ref 50–180)
TIBC: 446 ug/dL — AB (ref 250–425)
UIBC: 330 ug/dL (ref 125–400)

## 2016-01-13 ENCOUNTER — Ambulatory Visit (HOSPITAL_COMMUNITY)
Admission: RE | Admit: 2016-01-13 | Discharge: 2016-01-13 | Disposition: A | Discharge status: Home / Self Care | Payer: PPO | Source: Ambulatory Visit | Attending: Gastroenterology | Admitting: Gastroenterology

## 2016-01-13 DIAGNOSIS — K7030 Alcoholic cirrhosis of liver without ascites: Secondary | ICD-10-CM | POA: Insufficient documentation

## 2016-01-14 ENCOUNTER — Encounter: Payer: Self-pay | Admitting: Gastroenterology

## 2016-01-14 DIAGNOSIS — D6489 Other specified anemias: Secondary | ICD-10-CM | POA: Insufficient documentation

## 2016-01-14 NOTE — Assessment & Plan Note (Signed)
65 year old male with likely ETOH/fatty liver as culprit. Next EGD in 3 years for screening, no varices. Well compensated. Needs US abdomen now. Hep A/B vaccinations given to be done with PCP. Discussed alcohol cessation in depth today. Due to findings of reflux esophagitis and gastritis, start Protonix once daily. Return in 6 months.

## 2016-01-14 NOTE — Assessment & Plan Note (Signed)
Last Hgb 10. Likely chronic disease. EGD/TCS on file. Check iron, ferritin, TIBC. Chronic ETOH use likely playing a role as well.

## 2016-01-16 NOTE — Progress Notes (Signed)
cc'ed to pcp °

## 2016-01-31 NOTE — Progress Notes (Signed)
Ferritin elevated likely in setting of ETOH use. sats 26%, not consistent with iron overload. Needs to avoid ETOH and then will recheck ferritin at that time. US abdomen in 6 months.

## 2016-02-02 NOTE — Progress Notes (Signed)
Pt is aware. Routing to [place] to nic the [place] in 6 months.

## 2016-04-27 DIAGNOSIS — E782 Mixed hyperlipidemia: Secondary | ICD-10-CM | POA: Diagnosis not present

## 2016-04-27 DIAGNOSIS — E119 Type 2 diabetes mellitus without complications: Secondary | ICD-10-CM | POA: Diagnosis not present

## 2016-04-27 DIAGNOSIS — I10 Essential (primary) hypertension: Secondary | ICD-10-CM | POA: Diagnosis not present

## 2016-05-01 DIAGNOSIS — E782 Mixed hyperlipidemia: Secondary | ICD-10-CM | POA: Diagnosis not present

## 2016-05-01 DIAGNOSIS — Z6830 Body mass index (BMI) 30.0-30.9, adult: Secondary | ICD-10-CM | POA: Diagnosis not present

## 2016-05-01 DIAGNOSIS — E1122 Type 2 diabetes mellitus with diabetic chronic kidney disease: Secondary | ICD-10-CM | POA: Diagnosis not present

## 2016-05-01 DIAGNOSIS — I10 Essential (primary) hypertension: Secondary | ICD-10-CM | POA: Diagnosis not present

## 2016-05-01 DIAGNOSIS — Z0001 Encounter for general adult medical examination with abnormal findings: Secondary | ICD-10-CM | POA: Diagnosis not present

## 2016-05-01 DIAGNOSIS — N183 Chronic kidney disease, stage 3 (moderate): Secondary | ICD-10-CM | POA: Diagnosis not present

## 2016-05-30 DIAGNOSIS — E1122 Type 2 diabetes mellitus with diabetic chronic kidney disease: Secondary | ICD-10-CM | POA: Diagnosis not present

## 2016-05-30 DIAGNOSIS — Z6832 Body mass index (BMI) 32.0-32.9, adult: Secondary | ICD-10-CM | POA: Diagnosis not present

## 2016-05-30 DIAGNOSIS — N183 Chronic kidney disease, stage 3 (moderate): Secondary | ICD-10-CM | POA: Diagnosis not present

## 2016-05-30 DIAGNOSIS — M25562 Pain in left knee: Secondary | ICD-10-CM | POA: Diagnosis not present

## 2016-06-01 ENCOUNTER — Telehealth: Payer: Self-pay | Admitting: Orthopedic Surgery

## 2016-06-01 NOTE — Telephone Encounter (Signed)
Patient's daughter called returning Carol's call and I asked her if I could please put her on hold, that Arbie Cookey was not here and I was with a patient at this time, she said yes. When I finished checking in the patient I went to get this patient's referral information and when I went to talk with the daughter she had already hung up. I am in the middle of checking in patients, so when I finish for the day I will try to call her back.

## 2016-06-11 ENCOUNTER — Ambulatory Visit (INDEPENDENT_AMBULATORY_CARE_PROVIDER_SITE_OTHER): Payer: PPO | Admitting: Orthopedic Surgery

## 2016-06-11 ENCOUNTER — Ambulatory Visit (INDEPENDENT_AMBULATORY_CARE_PROVIDER_SITE_OTHER): Payer: PPO

## 2016-06-11 VITALS — BP 132/77 | HR 73 | Ht 66.0 in | Wt 202.0 lb

## 2016-06-11 DIAGNOSIS — M25562 Pain in left knee: Secondary | ICD-10-CM

## 2016-06-11 DIAGNOSIS — M1712 Unilateral primary osteoarthritis, left knee: Secondary | ICD-10-CM | POA: Diagnosis not present

## 2016-06-11 NOTE — Patient Instructions (Signed)

## 2016-06-11 NOTE — Progress Notes (Signed)
Patient ID: [name] [name], male   DOB: 08-27-1950, 66 y.o.   MRN: [id number]  Chief Complaint  Patient presents with  . Knee Pain    Left knee pain, no injury. Referred by Dr. Nevada Crane.    HPI: 66 year old male presents with left knee pain in the back of his knee. No prior treatments today. He does take gabapentin is been doing prolonged time he is diabetic  He has no catching locking or giving way but has severe weightbearing pain and occasional pain in the front of the knee over the medial compartment no effusion pain seems to be severe seems to be intermittent seems to be worse with activity  ROS Review of Systems  Constitutional: Negative for chills, fever and weight loss.  Respiratory: Negative for shortness of breath.   Cardiovascular: Negative for chest pain.  Neurological: Negative for tingling.    Past Medical History:  Diagnosis Date  . CAD (coronary artery disease) cath in 2011  . CAD in native artery 05/18/2014  . Cellulitis of toe, right july 2012  . Chronic diastolic heart failure ([place]) 05/18/2014  . Cirrhosis of liver ([place])    ? ETOH, fatty liver   . Diabetes mellitus   . Hyperlipidemia 05/18/2014  . Hypertension   . Nonischemic cardiomyopathy ([place]) 05/18/2014    PHYSICAL EXAM  BP 132/77   Pulse 73   Ht 5\' 6"  (1.676 m)   Wt 202 lb (91.6 kg)   BMI 32.60 kg/m  GENERAL appearance reveals no gross abnormalities  MENTAL STATUS we note that the patient is awake alert and oriented to person place and time MOOD/AFFECT ARE NORMAL   GAIT reveals Abnormal gait with slight limp  EXAM OF THE left KNEE SKIN no erythema lacerations or ecchymosis  INSPECTION medial joint line tenderness mild lateral joint line tenderness mild posterior popliteal fossa pain more symptomatic  Lumbar spine nontender  ROM Left knee 125 STABILITY of the cruciate and collateral ligaments were normal MOTOR GRADE 5/5    examination of the  right knee   no swelling or tenderness normal  alignment  VASC 2+ dorsalis pedis pulse normal capillary refill excellent warmth to the extremity  NEURO sensation and no pathologic reflexes  LYMPH deferred noncontributory   IMAGING STUDIES  I have independently reviewed the x-rays and I interpreted the x-rays as follows:  Moderate to severe arthritis left knee primarily medial compartment and patellofemoral compartment  Dx   primary osteoarthritis of the  Left knee  PLAN  Procedure note left knee injection verbal consent was obtained to inject left knee joint  Timeout was completed to confirm the site of injection  The medications used were 40 mg of Depo-Medrol and 1% lidocaine 3 cc  Anesthesia was provided by ethyl chloride and the skin was prepped with alcohol.  After cleaning the skin with alcohol a 20-gauge needle was used to inject the left knee joint. There were no complications. A sterile bandage was applied.    9:45 AM Arther Abbott, MD 06/11/2016

## 2016-07-03 ENCOUNTER — Telehealth: Payer: Self-pay | Admitting: Gastroenterology

## 2016-07-03 NOTE — Telephone Encounter (Signed)
Recall for ultrasound 

## 2016-07-03 NOTE — Telephone Encounter (Signed)
Letter mailed

## 2016-07-04 DIAGNOSIS — N183 Chronic kidney disease, stage 3 (moderate): Secondary | ICD-10-CM | POA: Diagnosis not present

## 2016-07-04 DIAGNOSIS — E1122 Type 2 diabetes mellitus with diabetic chronic kidney disease: Secondary | ICD-10-CM | POA: Diagnosis not present

## 2016-07-16 ENCOUNTER — Encounter: Payer: Self-pay | Admitting: Gastroenterology

## 2016-07-16 ENCOUNTER — Ambulatory Visit: Payer: PPO | Admitting: Gastroenterology

## 2016-07-16 ENCOUNTER — Telehealth: Payer: Self-pay | Admitting: Gastroenterology

## 2016-07-16 NOTE — Telephone Encounter (Signed)
Pt was a no show and letter sent  °

## 2016-08-29 DIAGNOSIS — Z1159 Encounter for screening for other viral diseases: Secondary | ICD-10-CM | POA: Diagnosis not present

## 2016-08-29 DIAGNOSIS — I10 Essential (primary) hypertension: Secondary | ICD-10-CM | POA: Diagnosis not present

## 2016-08-29 DIAGNOSIS — E1122 Type 2 diabetes mellitus with diabetic chronic kidney disease: Secondary | ICD-10-CM | POA: Diagnosis not present

## 2017-01-08 DIAGNOSIS — E1122 Type 2 diabetes mellitus with diabetic chronic kidney disease: Secondary | ICD-10-CM | POA: Diagnosis not present

## 2017-01-08 DIAGNOSIS — R109 Unspecified abdominal pain: Secondary | ICD-10-CM | POA: Diagnosis not present

## 2017-01-08 DIAGNOSIS — R079 Chest pain, unspecified: Secondary | ICD-10-CM | POA: Diagnosis not present

## 2017-01-08 DIAGNOSIS — Z6832 Body mass index (BMI) 32.0-32.9, adult: Secondary | ICD-10-CM | POA: Diagnosis not present

## 2017-01-08 DIAGNOSIS — H53452 Other localized visual field defect, left eye: Secondary | ICD-10-CM | POA: Diagnosis not present

## 2017-01-08 DIAGNOSIS — G4700 Insomnia, unspecified: Secondary | ICD-10-CM | POA: Diagnosis not present

## 2017-01-08 DIAGNOSIS — R1011 Right upper quadrant pain: Secondary | ICD-10-CM | POA: Diagnosis not present

## 2017-01-18 ENCOUNTER — Other Ambulatory Visit: Payer: Self-pay

## 2017-01-18 DIAGNOSIS — K7460 Unspecified cirrhosis of liver: Secondary | ICD-10-CM

## 2017-01-23 ENCOUNTER — Ambulatory Visit (HOSPITAL_COMMUNITY)
Admission: RE | Admit: 2017-01-23 | Discharge: 2017-01-23 | Disposition: A | Discharge status: Home / Self Care | Payer: PPO | Source: Ambulatory Visit | Attending: Gastroenterology | Admitting: Gastroenterology

## 2017-01-23 DIAGNOSIS — K7460 Unspecified cirrhosis of liver: Secondary | ICD-10-CM | POA: Diagnosis not present

## 2017-01-30 DIAGNOSIS — R4182 Altered mental status, unspecified: Secondary | ICD-10-CM | POA: Diagnosis not present

## 2017-01-30 DIAGNOSIS — I10 Essential (primary) hypertension: Secondary | ICD-10-CM | POA: Diagnosis not present

## 2017-01-30 DIAGNOSIS — E782 Mixed hyperlipidemia: Secondary | ICD-10-CM | POA: Diagnosis not present

## 2017-01-30 DIAGNOSIS — N183 Chronic kidney disease, stage 3 (moderate): Secondary | ICD-10-CM | POA: Diagnosis not present

## 2017-01-30 DIAGNOSIS — E1122 Type 2 diabetes mellitus with diabetic chronic kidney disease: Secondary | ICD-10-CM | POA: Diagnosis not present

## 2017-01-30 DIAGNOSIS — E119 Type 2 diabetes mellitus without complications: Secondary | ICD-10-CM | POA: Diagnosis not present

## 2017-01-30 DIAGNOSIS — R079 Chest pain, unspecified: Secondary | ICD-10-CM | POA: Diagnosis not present

## 2017-01-30 DIAGNOSIS — R5383 Other fatigue: Secondary | ICD-10-CM | POA: Diagnosis not present

## 2017-01-30 DIAGNOSIS — R109 Unspecified abdominal pain: Secondary | ICD-10-CM | POA: Diagnosis not present

## 2017-01-30 DIAGNOSIS — F5101 Primary insomnia: Secondary | ICD-10-CM | POA: Diagnosis not present

## 2017-01-30 DIAGNOSIS — E559 Vitamin D deficiency, unspecified: Secondary | ICD-10-CM | POA: Diagnosis not present

## 2017-01-30 DIAGNOSIS — Z6832 Body mass index (BMI) 32.0-32.9, adult: Secondary | ICD-10-CM | POA: Diagnosis not present

## 2017-01-30 DIAGNOSIS — K7460 Unspecified cirrhosis of liver: Secondary | ICD-10-CM | POA: Diagnosis not present

## 2017-02-04 NOTE — Progress Notes (Signed)
Known cirrhosis. Needs routine follow-up with [place] as he is overdue.

## 2017-02-05 NOTE — Progress Notes (Signed)
Pt is aware and will keep his appt on 03/22/2017 at 9:30 with Vicente Males.

## 2017-02-05 NOTE — Progress Notes (Signed)
PATIENT SCHEDULED  °

## 2017-03-22 ENCOUNTER — Ambulatory Visit (INDEPENDENT_AMBULATORY_CARE_PROVIDER_SITE_OTHER): Payer: PPO | Admitting: Gastroenterology

## 2017-03-22 ENCOUNTER — Other Ambulatory Visit: Payer: Self-pay

## 2017-03-22 ENCOUNTER — Encounter: Payer: Self-pay | Admitting: Gastroenterology

## 2017-03-22 VITALS — BP 121/75 | HR 69 | Temp 97.5°F | Ht 66.0 in | Wt 200.0 lb

## 2017-03-22 DIAGNOSIS — I25709 Atherosclerosis of coronary artery bypass graft(s), unspecified, with unspecified angina pectoris: Secondary | ICD-10-CM

## 2017-03-22 DIAGNOSIS — K2100 Gastro-esophageal reflux disease with esophagitis, without bleeding: Secondary | ICD-10-CM | POA: Insufficient documentation

## 2017-03-22 DIAGNOSIS — K7030 Alcoholic cirrhosis of liver without ascites: Secondary | ICD-10-CM

## 2017-03-22 MED ORDER — PANTOPRAZOLE SODIUM 40 MG PO TBEC: 40.0000 mg | DELAYED_RELEASE_TABLET | Freq: Two times a day (BID) | ORAL | 3 refills | Status: DC

## 2017-03-22 NOTE — Assessment & Plan Note (Signed)
Currently not on a PPI. Noting intermittent reflux and hiccups. Start Protonix BID for now. Also noting chronic chest tenderness with palpation radiating around to back, unclear etiology. Occasional left rib pain. No associated shortness of breath and not exacerbated by exertion. Will manage GERD and refer back to cardiology as he is overdue for routine follow-up. Return in 8 weeks.

## 2017-03-22 NOTE — Progress Notes (Signed)
Referring Provider: Celene Squibb, MD Primary Care Physician:  Celene Squibb, MD Primary GI: Dr. Oneida Alar   Chief Complaint  Patient presents with  . Follow-up    Cirrhosis    HPI:   [name] [name] is a 66 y.o. 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 EGD due in 2020 for variceal screening. Colonoscopy due in 2027. History of anemia with ferritin elevated in setting of cirrhosis, ETOH use. Needs to avoid alcohol with recheck of ferritin. 26% iron sats so does not appear iron overload. Last US abdomen in Aug 2018. Overdue for routine labs.   Right lower back pain. Hurts in the morning but gets better throughout the day. Doesn't take anything for it. No N/V. Good appetite. Eating less. Weight is stable. No diarrhea in a "long time" since quitting metformin. No rectal bleeding. Has mid sternum chest discomfort radiating all around to his back that feels "tender". Will have pain under his left rib intermittently. Feels sharp and stabbing. No shortness of breath associated with this. Not exacerbated by exertion. Not associated with eating/drinking. Not taking Protonix. Will maybe go out and eat pizza and have a beer with this, otherwise no alcohol.   At night will hiccup when turning over.   Past Medical History:  Diagnosis Date  . CAD (coronary artery disease) cath in 2011  . CAD in native artery 05/18/2014  . Cellulitis of toe, right july 2012  . Chronic diastolic heart failure ([place]) 05/18/2014  . Cirrhosis of liver ([place])    ? ETOH, fatty liver   . Diabetes mellitus   . Hyperlipidemia 05/18/2014  . Hypertension   . Nonischemic cardiomyopathy ([place]) 05/18/2014    Past Surgical History:  Procedure Laterality Date  . CARDIAC CATHETERIZATION  05/24/2010  . Carotid duplex  05/23/2010  . CARPAL TUNNEL RELEASE    . CERVICAL SPINE SURGERY    . COLONOSCOPY  FEB 2010 ARS NUR [place]   NL TCS  . COLONOSCOPY N/A 08/01/2015   Dr. Oneida Alar: small internal hemorrhoids, next  TCS in 10 years   . DOPPLER ECHOCARDIOGRAPHY  05/09/2010   EF 45-50%  . ESOPHAGOGASTRODUODENOSCOPY  02/07/2011   Dr. Oneida Alar: H.pylori gastritis, mild portal gastropathy, treatmetn with Amoxicillin/Biaxin   . ESOPHAGOGASTRODUODENOSCOPY N/A 08/01/2015   Dr. Oneida Alar: Reflux esophagitis, mild gastritis, next EGD in 3 years   . lower ext atrerial duplex  05/23/2010  . NM MYOVIEW LTD  01/08/2008   EF 53%  Low risk scan  . sleep study  01/01/2008    Current Outpatient Prescriptions  Medication Sig Dispense Refill  . aspirin 81 MG tablet Take 81 mg by mouth daily.      . carvedilol (COREG) 12.5 MG tablet Take 12.5 mg by mouth 2 (two) times daily with a meal.    . fish oil-omega-3 fatty acids 1000 MG capsule Take 2 g by mouth daily.      . fluocinonide cream (LIDEX) 6.07 % Apply 1 application topically 2 (two) times daily.    Marland Kitchen gabapentin (NEURONTIN) 100 MG tablet Take 600 mg by mouth 2 (two) times daily.     . insulin aspart (NOVOLOG) 100 UNIT/ML injection Inject 10 Units into the skin 3 (three) times daily before meals.    . insulin glargine (LANTUS) 100 UNIT/ML injection Inject 45 Units into the skin 2 (two) times daily.    Marland Kitchen lisinopril (PRINIVIL,ZESTRIL) 20 MG tablet Take 1 tablet (20 mg total) by mouth daily. 90 tablet 2  .  sildenafil (VIAGRA) 100 MG tablet Take 100 mg by mouth daily as needed for erectile dysfunction.    . traZODone (DESYREL) 100 MG tablet Take 100 mg by mouth at bedtime.    Marland Kitchen LEVEMIR FLEXTOUCH 100 UNIT/ML Pen Inject 46 Units into the skin daily.    . pantoprazole (PROTONIX) 40 MG tablet Take 1 tablet (40 mg total) by mouth daily. Take 30 minutes before breakfast (Patient not taking: Reported on 03/22/2017) 90 tablet 3  . venlafaxine (EFFEXOR) 75 MG tablet Take 75 mg by mouth 3 (three) times daily with meals.     No current facility-administered medications for this visit.     Allergies as of 03/22/2017 - Review Complete 03/22/2017  Allergen Reaction Noted  . Naprosyn  [naproxen] Hives 11/30/2010    Family History  Problem Relation Age of Onset  . Colon cancer Neg Hx   . Liver disease Neg Hx     Social History   Social History  . Marital status: Single    Spouse name: N/A  . Number of children: N/A  . Years of education: N/A   Social History Main Topics  . Smoking status: Never Smoker  . Smokeless tobacco: Never Used  . Alcohol use 1.8 oz/week    3 Cans of beer per week     Comment: very rare alcohol use   . Drug use: No     Comment: as a young teen  . Sexual activity: Not Asked   Other Topics Concern  . None   Social History Narrative  . None    Review of Systems: As mentioned in HPI   Physical Exam: BP 121/75   Pulse 69   Temp (!) 97.5 F (36.4 C) (Oral)   Ht 5\' 6"  (1.676 m)   Wt 200 lb (90.7 kg)   BMI 32.28 kg/m  General:   Alert and oriented. No distress noted. Pleasant and cooperative.  Head:  Normocephalic and atraumatic. Eyes:  Conjuctiva clear without scleral icterus. Mouth:  Oral mucosa pink and moist. Good dentition. No lesions. Abdomen:  +BS, soft, non-tender and non-distended. No rebound or guarding. No HSM or masses noted. Msk:  Symmetrical without gross deformities. Normal posture. Extremities:  Without edema. Neurologic:  Alert and  oriented x4 Psych:  Alert and cooperative. Normal mood and affect.

## 2017-03-22 NOTE — Assessment & Plan Note (Signed)
66 year old male presenting with history of ETOH/NASH cirrhosis, well compensated. Needs repeat labs today. US abdomen due in Feb 2018, EGD in 2020.

## 2017-03-22 NOTE — Patient Instructions (Addendum)
Please complete labs today.   If you have chest pain with exerting yourself, shortness of breath, etc, then seek emergency medical attention. I don't think what you are describing is cardiac related, but we need to have you see a cardiologist for routine follow-up.  You have a history of reflux and esophagitis, so I am starting you back on Protonix. You need to take this twice a day, 30 minutes before breakfast and dinner.   I will see you back in 8 weeks!

## 2017-03-25 NOTE — Progress Notes (Signed)
CC'ED TO PCP 

## 2017-03-29 DIAGNOSIS — I129 Hypertensive chronic kidney disease with stage 1 through stage 4 chronic kidney disease, or unspecified chronic kidney disease: Secondary | ICD-10-CM | POA: Diagnosis not present

## 2017-03-29 DIAGNOSIS — M792 Neuralgia and neuritis, unspecified: Secondary | ICD-10-CM | POA: Diagnosis not present

## 2017-03-29 DIAGNOSIS — R109 Unspecified abdominal pain: Secondary | ICD-10-CM | POA: Diagnosis not present

## 2017-03-29 DIAGNOSIS — R5383 Other fatigue: Secondary | ICD-10-CM | POA: Diagnosis not present

## 2017-03-29 DIAGNOSIS — K7460 Unspecified cirrhosis of liver: Secondary | ICD-10-CM | POA: Diagnosis not present

## 2017-03-29 DIAGNOSIS — E559 Vitamin D deficiency, unspecified: Secondary | ICD-10-CM | POA: Diagnosis not present

## 2017-03-29 DIAGNOSIS — K7030 Alcoholic cirrhosis of liver without ascites: Secondary | ICD-10-CM | POA: Diagnosis not present

## 2017-03-29 DIAGNOSIS — R410 Disorientation, unspecified: Secondary | ICD-10-CM | POA: Diagnosis not present

## 2017-03-29 DIAGNOSIS — E782 Mixed hyperlipidemia: Secondary | ICD-10-CM | POA: Diagnosis not present

## 2017-03-29 DIAGNOSIS — N183 Chronic kidney disease, stage 3 (moderate): Secondary | ICD-10-CM | POA: Diagnosis not present

## 2017-03-29 DIAGNOSIS — E1122 Type 2 diabetes mellitus with diabetic chronic kidney disease: Secondary | ICD-10-CM | POA: Diagnosis not present

## 2017-03-29 DIAGNOSIS — G9009 Other idiopathic peripheral autonomic neuropathy: Secondary | ICD-10-CM | POA: Diagnosis not present

## 2017-03-29 DIAGNOSIS — G4700 Insomnia, unspecified: Secondary | ICD-10-CM | POA: Diagnosis not present

## 2017-03-30 LAB — COMPLETE METABOLIC PANEL WITH GFR
AG Ratio: 1.4 (calc) (ref 1.0–2.5)
ALBUMIN MSPROF: 4.2 g/dL (ref 3.6–5.1)
ALKALINE PHOSPHATASE (APISO): 90 U/L (ref 40–115)
ALT: 16 U/L (ref 9–46)
AST: 16 U/L (ref 10–35)
BUN: 21 mg/dL (ref 7–25)
CALCIUM: 9.4 mg/dL (ref 8.6–10.3)
CO2: 28 mmol/L (ref 20–32)
CREATININE: 1.2 mg/dL (ref 0.70–1.25)
Chloride: 102 mmol/L (ref 98–110)
GFR, EST NON AFRICAN AMERICAN: 62 mL/min/{1.73_m2} (ref >=60)
GFR, Est African American: 72 mL/min/{1.73_m2} (ref >=60)
GLOBULIN: 3.1 g/dL (ref 1.9–3.7)
GLUCOSE: 268 mg/dL — AB (ref 65–139)
Potassium: 4.6 mmol/L (ref 3.5–5.3)
Sodium: 140 mmol/L (ref 135–146)
Total Bilirubin: 0.6 mg/dL (ref 0.2–1.2)
Total Protein: 7.3 g/dL (ref 6.1–8.1)

## 2017-03-30 LAB — CBC WITH DIFFERENTIAL/PLATELET
BASOS PCT: 0.6 %
Basophils Absolute: 65 cells/uL (ref 0–200)
EOS ABS: 194 {cells}/uL (ref 15–500)
Eosinophils Relative: 1.8 %
HEMATOCRIT: 44.9 % (ref 38.5–50.0)
Hemoglobin: 15.4 g/dL (ref 13.2–17.1)
LYMPHS ABS: 3316 {cells}/uL (ref 850–3900)
MCH: 31.8 pg (ref 27.0–33.0)
MCHC: 34.3 g/dL (ref 32.0–36.0)
MCV: 92.6 fL (ref 80.0–100.0)
MPV: 10.5 fL (ref 7.5–12.5)
Monocytes Relative: 5.5 %
Neutro Abs: 6631 cells/uL (ref 1500–7800)
Neutrophils Relative %: 61.4 %
Platelets: 223 10*3/uL (ref 140–400)
RBC: 4.85 10*6/uL (ref 4.20–5.80)
RDW: 12.7 % (ref 11.0–15.0)
Total Lymphocyte: 30.7 %
WBC: 10.8 10*3/uL (ref 3.8–10.8)
WBCMIX: 594 {cells}/uL (ref 200–950)

## 2017-03-30 LAB — PROTIME-INR
INR: 1
PROTHROMBIN TIME: 10.4 s (ref 9.0–11.5)

## 2017-03-30 LAB — IRON,TIBC AND FERRITIN PANEL
%SAT: 26 % (ref 15–60)
FERRITIN: 255 ng/mL (ref 20–380)
Iron: 79 ug/dL (ref 50–180)
TIBC: 307 mcg/dL (calc) (ref 250–425)

## 2017-04-08 NOTE — Progress Notes (Signed)
MELD Na 8. No concerning findings on labs. Will see him in December.

## 2017-04-09 NOTE — Progress Notes (Signed)
Pt is aware.  

## 2017-04-10 ENCOUNTER — Other Ambulatory Visit (HOSPITAL_COMMUNITY): Payer: Self-pay | Admitting: Internal Medicine

## 2017-04-10 DIAGNOSIS — M5414 Radiculopathy, thoracic region: Secondary | ICD-10-CM

## 2017-04-10 DIAGNOSIS — R079 Chest pain, unspecified: Secondary | ICD-10-CM

## 2017-04-15 ENCOUNTER — Ambulatory Visit (HOSPITAL_COMMUNITY)
Admission: RE | Admit: 2017-04-15 | Discharge: 2017-04-15 | Disposition: A | Discharge status: Home / Self Care | Payer: PPO | Source: Ambulatory Visit | Attending: Internal Medicine | Admitting: Internal Medicine

## 2017-04-15 DIAGNOSIS — R079 Chest pain, unspecified: Secondary | ICD-10-CM

## 2017-04-15 DIAGNOSIS — M5134 Other intervertebral disc degeneration, thoracic region: Secondary | ICD-10-CM | POA: Diagnosis not present

## 2017-04-15 DIAGNOSIS — M5414 Radiculopathy, thoracic region: Secondary | ICD-10-CM

## 2017-04-15 DIAGNOSIS — M5124 Other intervertebral disc displacement, thoracic region: Secondary | ICD-10-CM | POA: Diagnosis not present

## 2017-04-26 DIAGNOSIS — M5414 Radiculopathy, thoracic region: Secondary | ICD-10-CM | POA: Diagnosis not present

## 2017-04-26 DIAGNOSIS — M4802 Spinal stenosis, cervical region: Secondary | ICD-10-CM | POA: Diagnosis not present

## 2017-04-29 ENCOUNTER — Other Ambulatory Visit: Payer: Self-pay | Admitting: Neurosurgery

## 2017-04-29 DIAGNOSIS — M5414 Radiculopathy, thoracic region: Secondary | ICD-10-CM

## 2017-04-29 DIAGNOSIS — M4802 Spinal stenosis, cervical region: Secondary | ICD-10-CM

## 2017-05-01 DIAGNOSIS — K7460 Unspecified cirrhosis of liver: Secondary | ICD-10-CM | POA: Diagnosis not present

## 2017-05-01 DIAGNOSIS — N183 Chronic kidney disease, stage 3 (moderate): Secondary | ICD-10-CM | POA: Diagnosis not present

## 2017-05-01 DIAGNOSIS — R109 Unspecified abdominal pain: Secondary | ICD-10-CM | POA: Diagnosis not present

## 2017-05-01 DIAGNOSIS — E559 Vitamin D deficiency, unspecified: Secondary | ICD-10-CM | POA: Diagnosis not present

## 2017-05-01 DIAGNOSIS — R410 Disorientation, unspecified: Secondary | ICD-10-CM | POA: Diagnosis not present

## 2017-05-01 DIAGNOSIS — I129 Hypertensive chronic kidney disease with stage 1 through stage 4 chronic kidney disease, or unspecified chronic kidney disease: Secondary | ICD-10-CM | POA: Diagnosis not present

## 2017-05-01 DIAGNOSIS — E782 Mixed hyperlipidemia: Secondary | ICD-10-CM | POA: Diagnosis not present

## 2017-05-01 DIAGNOSIS — M792 Neuralgia and neuritis, unspecified: Secondary | ICD-10-CM | POA: Diagnosis not present

## 2017-05-01 DIAGNOSIS — G9009 Other idiopathic peripheral autonomic neuropathy: Secondary | ICD-10-CM | POA: Diagnosis not present

## 2017-05-01 DIAGNOSIS — R5383 Other fatigue: Secondary | ICD-10-CM | POA: Diagnosis not present

## 2017-05-01 DIAGNOSIS — G4700 Insomnia, unspecified: Secondary | ICD-10-CM | POA: Diagnosis not present

## 2017-05-01 DIAGNOSIS — E1122 Type 2 diabetes mellitus with diabetic chronic kidney disease: Secondary | ICD-10-CM | POA: Diagnosis not present

## 2017-05-03 DIAGNOSIS — G4700 Insomnia, unspecified: Secondary | ICD-10-CM | POA: Diagnosis not present

## 2017-05-03 DIAGNOSIS — E1122 Type 2 diabetes mellitus with diabetic chronic kidney disease: Secondary | ICD-10-CM | POA: Diagnosis not present

## 2017-05-03 DIAGNOSIS — E782 Mixed hyperlipidemia: Secondary | ICD-10-CM | POA: Diagnosis not present

## 2017-05-03 DIAGNOSIS — K7460 Unspecified cirrhosis of liver: Secondary | ICD-10-CM | POA: Diagnosis not present

## 2017-05-03 DIAGNOSIS — I129 Hypertensive chronic kidney disease with stage 1 through stage 4 chronic kidney disease, or unspecified chronic kidney disease: Secondary | ICD-10-CM | POA: Diagnosis not present

## 2017-05-03 DIAGNOSIS — Z6832 Body mass index (BMI) 32.0-32.9, adult: Secondary | ICD-10-CM | POA: Diagnosis not present

## 2017-05-03 DIAGNOSIS — N183 Chronic kidney disease, stage 3 (moderate): Secondary | ICD-10-CM | POA: Diagnosis not present

## 2017-05-03 DIAGNOSIS — R410 Disorientation, unspecified: Secondary | ICD-10-CM | POA: Diagnosis not present

## 2017-05-03 DIAGNOSIS — G9009 Other idiopathic peripheral autonomic neuropathy: Secondary | ICD-10-CM | POA: Diagnosis not present

## 2017-05-03 DIAGNOSIS — R079 Chest pain, unspecified: Secondary | ICD-10-CM | POA: Diagnosis not present

## 2017-05-09 ENCOUNTER — Ambulatory Visit
Admission: RE | Admit: 2017-05-09 | Discharge: 2017-05-09 | Disposition: A | Discharge status: Home / Self Care | Payer: PPO | Source: Ambulatory Visit | Attending: Neurosurgery | Admitting: Neurosurgery

## 2017-05-09 DIAGNOSIS — M5414 Radiculopathy, thoracic region: Secondary | ICD-10-CM

## 2017-05-09 DIAGNOSIS — M5124 Other intervertebral disc displacement, thoracic region: Secondary | ICD-10-CM | POA: Diagnosis not present

## 2017-05-09 DIAGNOSIS — M4802 Spinal stenosis, cervical region: Secondary | ICD-10-CM

## 2017-05-09 DIAGNOSIS — M50221 Other cervical disc displacement at C4-C5 level: Secondary | ICD-10-CM | POA: Diagnosis not present

## 2017-05-09 MED ORDER — ONDANSETRON HCL 4 MG/2ML IJ SOLN
4.0000 mg | Freq: Once | INTRAMUSCULAR | Status: AC
  Administered 2017-05-09: 4 mg via INTRAMUSCULAR

## 2017-05-09 MED ORDER — MEPERIDINE HCL 100 MG/ML IJ SOLN
75.0000 mg | Freq: Once | INTRAMUSCULAR | Status: AC
  Administered 2017-05-09: 75 mg via INTRAMUSCULAR

## 2017-05-09 MED ORDER — DIAZEPAM 5 MG PO TABS
5.0000 mg | ORAL_TABLET | Freq: Once | ORAL | Status: AC
  Administered 2017-05-09: 5 mg via ORAL

## 2017-05-09 MED ORDER — IOPAMIDOL (ISOVUE-M 300) INJECTION 61%
10.0000 mL | Freq: Once | INTRAMUSCULAR | Status: AC | PRN
  Administered 2017-05-09: 10 mL via INTRATHECAL

## 2017-05-09 NOTE — Discharge Instructions (Signed)

## 2017-05-15 DIAGNOSIS — I10 Essential (primary) hypertension: Secondary | ICD-10-CM | POA: Diagnosis not present

## 2017-05-15 DIAGNOSIS — M4802 Spinal stenosis, cervical region: Secondary | ICD-10-CM | POA: Diagnosis not present

## 2017-05-15 DIAGNOSIS — Z6832 Body mass index (BMI) 32.0-32.9, adult: Secondary | ICD-10-CM | POA: Diagnosis not present

## 2017-05-16 ENCOUNTER — Other Ambulatory Visit: Payer: Self-pay | Admitting: Neurosurgery

## 2017-05-23 ENCOUNTER — Ambulatory Visit: Payer: PPO | Admitting: Gastroenterology

## 2017-05-28 DIAGNOSIS — I639 Cerebral infarction, unspecified: Secondary | ICD-10-CM

## 2017-05-28 HISTORY — DX: Cerebral infarction, unspecified: I63.9

## 2017-06-10 NOTE — Pre-Procedure Instructions (Signed)
[name] [name]  06/10/2017      [facility] - [name], [place] - [address] [place] [address] Phone: [phone] Fax: [phone]    Your procedure is scheduled on Friday, June 14, 2016  Report to [facility] Admitting Entrance "A" at 5:30AM   Call this number if you have problems the morning of surgery:  [phone]   Remember:  Do not eat food or drink liquids after midnight.  Take these medicines the morning of surgery with A SIP OF WATER: Carvedilol (COREG), Gabapentin (NEURONTIN), and Desvenlafaxine (PRISTIQ).  Follow your doctor's instruction regarding Aspirin.  As of today, stop taking all Aspirins, Vitamins, Fish oils, and Herbal medications. Also stop all NSAIDS i.e. Advil, Ibuprofen, Motrin, Aleve, Anaprox, Naproxen, BC and Goody Powders.  How to Manage Your Diabetes Before and After Surgery  Why is it important to control my blood sugar before and after surgery? . Improving blood sugar levels before and after surgery helps healing and can limit problems. . A way of improving blood sugar control is eating a healthy diet by: o  Eating less sugar and carbohydrates o  Increasing activity/exercise o  Talking with your doctor about reaching your blood sugar goals . High blood sugars (greater than 180 mg/dL) can raise your risk of infections and slow your recovery, so you will need to focus on controlling your diabetes during the weeks before surgery. . Make sure that the doctor who takes care of your diabetes knows about your planned surgery including the date and location.  How do I manage my blood sugar before surgery? . Check your blood sugar at least 4 times a day, starting 2 days before surgery, to make sure that the level is not too high or low. o Check your blood sugar the morning of your surgery when you wake up and every 2 hours until you get to the Short Stay unit. . If your blood sugar is less than 70 mg/dL, you  will need to treat for low blood sugar: o Do not take insulin. o Treat a low blood sugar (less than 70 mg/dL) with  cup of clear juice (cranberry or apple), 4 glucose tablets, OR glucose gel. Recheck blood sugar in 15 minutes after treatment (to make sure it is greater than 70 mg/dL). If your blood sugar is not greater than 70 mg/dL on recheck, call [phone] o  for further instructions. . Report your blood sugar to the short stay nurse when you get to Short Stay.  . If you are admitted to the hospital after surgery: o Your blood sugar will be checked by the staff and you will probably be given insulin after surgery (instead of oral diabetes medicines) to make sure you have good blood sugar levels. o The goal for blood sugar control after surgery is 80-180 mg/dL.  WHAT DO I DO ABOUT MY DIABETES MEDICATION?  Marland Kitchen Do not take Empagliflozin (JARDIANCE) the morning of surgery.  . THE NIGHT BEFORE SURGERY, take ____25_______ units of ____Lantus_______insulin.    . THE MORNING OF SURGERY, take _____25________ units of ____Lantus______insulin.  . If your CBG is greater than 220 mg/dL, you may take  of your sliding scale (correction) dose of insulin.   Do not wear jewelry.  Do not wear lotions, powders, colognes, or deodorant.  Do not shave 48 hours prior to surgery.  Men may shave face and neck.  Do not bring valuables to the hospital.  [name] is not responsible for any belongings or valuables.  Contacts, dentures or bridgework may not be worn into surgery.  Leave your suitcase in the car.  After surgery it may be brought to your room.  For patients admitted to the hospital, discharge time will be determined by your treatment team.  Patients discharged the day of surgery will not be allowed to drive home.   Special instructions: [name]- Preparing For Surgery  Before surgery, you can play an important role. Because skin is not sterile, your skin needs to be as free of germs as  possible. You can reduce the number of germs on your skin by washing with CHG (chlorahexidine gluconate) Soap before surgery.  CHG is an antiseptic cleaner which kills germs and bonds with the skin to continue killing germs even after washing.  Please do not use if you have an allergy to CHG or antibacterial soaps. If your skin becomes reddened/irritated stop using the CHG.  Do not shave (including legs and underarms) for at least 48 hours prior to first CHG shower. It is OK to shave your face.  Please follow these instructions carefully.   1. Shower the NIGHT BEFORE SURGERY and the MORNING OF SURGERY with CHG.   2. If you chose to wash your hair, wash your hair first as usual with your normal shampoo.  3. After you shampoo, rinse your hair and body thoroughly to remove the shampoo.  4. Use CHG as you would any other liquid soap. You can apply CHG directly to the skin and wash gently with a scrungie or a clean washcloth.   5. Apply the CHG Soap to your body ONLY FROM THE NECK DOWN.  Do not use on open wounds or open sores. Avoid contact with your eyes, ears, mouth and genitals (private parts). Wash Face and genitals (private parts)  with your normal soap.  6. Wash thoroughly, paying special attention to the area where your surgery will be performed.  7. Thoroughly rinse your body with warm water from the neck down.  8. DO NOT shower/wash with your normal soap after using and rinsing off the CHG Soap.  9. Pat yourself dry with a CLEAN TOWEL.  10. Wear CLEAN PAJAMAS to bed the night before surgery, wear comfortable clothes the morning of surgery  11. Place CLEAN SHEETS on your bed the night of your first shower and DO NOT SLEEP WITH PETS.  Day of Surgery: Do not apply any deodorants/lotions. Please wear clean clothes to the hospital/surgery center.    Please read over the following fact sheets that you were given. Pain Booklet, Coughing and Deep Breathing, MRSA Information and Surgical  Site Infection Prevention

## 2017-06-11 ENCOUNTER — Encounter (HOSPITAL_COMMUNITY)
Admission: RE | Admit: 2017-06-11 | Discharge: 2017-06-11 | Disposition: A | Discharge status: Home / Self Care | Payer: PPO | Source: Ambulatory Visit | Attending: Neurosurgery | Admitting: Neurosurgery

## 2017-06-11 ENCOUNTER — Other Ambulatory Visit: Payer: Self-pay

## 2017-06-11 ENCOUNTER — Encounter (HOSPITAL_COMMUNITY): Payer: Self-pay

## 2017-06-11 DIAGNOSIS — Z7982 Long term (current) use of aspirin: Secondary | ICD-10-CM

## 2017-06-11 DIAGNOSIS — Z0183 Encounter for blood typing: Secondary | ICD-10-CM

## 2017-06-11 DIAGNOSIS — E119 Type 2 diabetes mellitus without complications: Secondary | ICD-10-CM

## 2017-06-11 DIAGNOSIS — Z01812 Encounter for preprocedural laboratory examination: Secondary | ICD-10-CM

## 2017-06-11 DIAGNOSIS — Z794 Long term (current) use of insulin: Secondary | ICD-10-CM

## 2017-06-11 DIAGNOSIS — K7460 Unspecified cirrhosis of liver: Secondary | ICD-10-CM | POA: Diagnosis not present

## 2017-06-11 DIAGNOSIS — E785 Hyperlipidemia, unspecified: Secondary | ICD-10-CM | POA: Diagnosis not present

## 2017-06-11 DIAGNOSIS — I2510 Atherosclerotic heart disease of native coronary artery without angina pectoris: Secondary | ICD-10-CM | POA: Diagnosis not present

## 2017-06-11 DIAGNOSIS — Z01818 Encounter for other preprocedural examination: Secondary | ICD-10-CM

## 2017-06-11 DIAGNOSIS — Z79899 Other long term (current) drug therapy: Secondary | ICD-10-CM | POA: Diagnosis not present

## 2017-06-11 DIAGNOSIS — M50021 Cervical disc disorder at C4-C5 level with myelopathy: Secondary | ICD-10-CM | POA: Diagnosis not present

## 2017-06-11 DIAGNOSIS — M2578 Osteophyte, vertebrae: Secondary | ICD-10-CM | POA: Diagnosis not present

## 2017-06-11 DIAGNOSIS — I10 Essential (primary) hypertension: Secondary | ICD-10-CM

## 2017-06-11 DIAGNOSIS — I428 Other cardiomyopathies: Secondary | ICD-10-CM | POA: Diagnosis not present

## 2017-06-11 DIAGNOSIS — M4322 Fusion of spine, cervical region: Secondary | ICD-10-CM | POA: Diagnosis not present

## 2017-06-11 DIAGNOSIS — M4802 Spinal stenosis, cervical region: Secondary | ICD-10-CM

## 2017-06-11 DIAGNOSIS — Z886 Allergy status to analgesic agent status: Secondary | ICD-10-CM | POA: Diagnosis not present

## 2017-06-11 DIAGNOSIS — K210 Gastro-esophageal reflux disease with esophagitis: Secondary | ICD-10-CM | POA: Diagnosis not present

## 2017-06-11 DIAGNOSIS — I110 Hypertensive heart disease with heart failure: Secondary | ICD-10-CM | POA: Diagnosis not present

## 2017-06-11 DIAGNOSIS — F329 Major depressive disorder, single episode, unspecified: Secondary | ICD-10-CM | POA: Diagnosis not present

## 2017-06-11 DIAGNOSIS — M4712 Other spondylosis with myelopathy, cervical region: Secondary | ICD-10-CM | POA: Diagnosis not present

## 2017-06-11 DIAGNOSIS — I5032 Chronic diastolic (congestive) heart failure: Secondary | ICD-10-CM | POA: Diagnosis not present

## 2017-06-11 HISTORY — DX: Depression, unspecified: F32.A

## 2017-06-11 HISTORY — DX: Anxiety disorder, unspecified: F41.9

## 2017-06-11 HISTORY — DX: Unspecified osteoarthritis, unspecified site: M19.90

## 2017-06-11 HISTORY — DX: Major depressive disorder, single episode, unspecified: F32.9

## 2017-06-11 LAB — HEMOGLOBIN A1C
HEMOGLOBIN A1C: 7.8 % — AB (ref 4.8–5.6)
MEAN PLASMA GLUCOSE: 177.16 mg/dL

## 2017-06-11 LAB — COMPREHENSIVE METABOLIC PANEL
ALT: 28 U/L (ref 17–63)
AST: 24 U/L (ref 15–41)
Albumin: 3.8 g/dL (ref 3.5–5.0)
Alkaline Phosphatase: 95 U/L (ref 38–126)
Anion gap: 12 (ref 5–15)
BUN: 20 mg/dL (ref 6–20)
CO2: 21 mmol/L — ABNORMAL LOW (ref 22–32)
Calcium: 9.5 mg/dL (ref 8.9–10.3)
Chloride: 105 mmol/L (ref 101–111)
Creatinine, Ser: 1.27 mg/dL — ABNORMAL HIGH (ref 0.61–1.24)
GFR, EST NON AFRICAN AMERICAN: 56 mL/min — AB (ref >60)
Glucose, Bld: 158 mg/dL — ABNORMAL HIGH (ref 65–99)
Potassium: 4.4 mmol/L (ref 3.5–5.1)
Sodium: 138 mmol/L (ref 135–145)
Total Bilirubin: 0.8 mg/dL (ref 0.3–1.2)
Total Protein: 7.4 g/dL (ref 6.5–8.1)

## 2017-06-11 LAB — CBC
HEMATOCRIT: 44.7 % (ref 39.0–52.0)
Hemoglobin: 15.5 g/dL (ref 13.0–17.0)
MCH: 32.2 pg (ref 26.0–34.0)
MCHC: 34.7 g/dL (ref 30.0–36.0)
MCV: 92.9 fL (ref 78.0–100.0)
Platelets: 199 10*3/uL (ref 150–400)
RBC: 4.81 MIL/uL (ref 4.22–5.81)
RDW: 13 % (ref 11.5–15.5)
WBC: 9.5 10*3/uL (ref 4.0–10.5)

## 2017-06-11 LAB — ABO/RH: ABO/RH(D): B POS

## 2017-06-11 LAB — TYPE AND SCREEN
ABO/RH(D): B POS
ANTIBODY SCREEN: NEGATIVE

## 2017-06-11 LAB — GLUCOSE, CAPILLARY: Glucose-Capillary: 186 mg/dL — ABNORMAL HIGH (ref 65–99)

## 2017-06-11 LAB — SURGICAL PCR SCREEN
MRSA, PCR: NEGATIVE
Staphylococcus aureus: NEGATIVE

## 2017-06-11 MED ORDER — CHLORHEXIDINE GLUCONATE CLOTH 2 % EX PADS: 6.0000 | MEDICATED_PAD | Freq: Once | CUTANEOUS | Status: DC

## 2017-06-11 NOTE — Progress Notes (Addendum)
PCP: Allyn Kenner, MD  Cardiologist: pt denies  EKG: denies past year, obtained today  Stress test: 05/25/14 in EPIC  ECHO: 05/25/14 in EPIC  Cardiac Cath: 05/24/10 in EPIC  Chest x-ray: pt denies past year, no recent respiratory infections/complications

## 2017-06-12 NOTE — Progress Notes (Addendum)
Anesthesia Chart Review:  Pt is a 67 year old male scheduled for C4-5 ACDF on 06/14/2017 with Earnie Larsson, MD  - PCP is Allyn Kenner, MD - Was seeing cardiologist Sanda Klein, MD. Last office visit 11/18/14; 1 year f/u recommended but did not happen  PMH includes:  CAD (mild-moderate by 2011 cath), nonischemic cardiomyopathy, HTN, DM, liver cirrhosis, hyperlipidemia. Never smoker. BMI 32  Medications include: ASA 81 mg, carvedilol, Jardiance, NovoLog, Lantus, sildenafil  BP 127/68   Pulse 83   Temp 36.7 C   Resp 20   Ht 5\' 6"  (1.676 m)   Wt 195 lb 12.8 oz (88.8 kg)   SpO2 95%   BMI 31.60 kg/m   Preoperative labs reviewed.   - HbA1c 7.8, glucose 158  EKG 06/11/17: NSR. LAD. LAFB. Poor R wave progression  Echo 05/25/14:  - Left ventricle: The cavity size was normal. Wall thickness wasnormal. Systolic function was normal. The estimated ejectionfraction was in the range of 50% to 55%. Doppler parameters areconsistent with abnormal left ventricular relaxation (grade 1diastolic dysfunction). - Mitral valve: There was mild regurgitation.  Nuclear stress test 05/25/14:  - Low risk stress nuclear study Apical thinning. - LV Wall Motion:  Mild to moderate global LV dysfunction  Cardiac cath 05/24/10: 1.  Nonobstructive CAD (LAD 30%; CX 50%) 2.  RCA not able to be engaged as there was no ostium (CX supplies entire RCA territory)  Reviewed case with Dr. Gifford Shave. Pt will need to be evaluated by cardiology given history of moderate CAD prior to surgery. I notified Lorriane Shire in Dr. Marchelle Folks office.   Willeen Cass, FNP-BC [facility]/Anesthesiology Phone: [phone] 06/12/2017 2:20 PM   Addendum:   Pt saw Leanor Kail, PA with cardiology 06/13/17 for pre-op eval and was cleared for surgery.   If no changes, I anticipate pt can proceed with surgery as scheduled.   Willeen Cass, FNP-BC [facility]/Anesthesiology Phone:  [phone] 06/13/2017 2:38 PM

## 2017-06-13 ENCOUNTER — Encounter (INDEPENDENT_AMBULATORY_CARE_PROVIDER_SITE_OTHER): Payer: Self-pay

## 2017-06-13 ENCOUNTER — Ambulatory Visit: Payer: PPO | Admitting: Physician Assistant

## 2017-06-13 ENCOUNTER — Encounter: Payer: Self-pay | Admitting: Physician Assistant

## 2017-06-13 VITALS — BP 148/78 | HR 78 | Resp 16 | Ht 65.0 in | Wt 195.4 lb

## 2017-06-13 DIAGNOSIS — Z01818 Encounter for other preprocedural examination: Secondary | ICD-10-CM

## 2017-06-13 DIAGNOSIS — I2510 Atherosclerotic heart disease of native coronary artery without angina pectoris: Secondary | ICD-10-CM

## 2017-06-13 DIAGNOSIS — I5032 Chronic diastolic (congestive) heart failure: Secondary | ICD-10-CM | POA: Diagnosis not present

## 2017-06-13 DIAGNOSIS — I10 Essential (primary) hypertension: Secondary | ICD-10-CM

## 2017-06-13 NOTE — Anesthesia Preprocedure Evaluation (Addendum)
Anesthesia Evaluation    Reviewed: Allergy & Precautions, Patient's Chart, lab work & pertinent test results  Airway Mallampati: II  TM Distance: >3 FB Neck ROM: Full    Dental no notable dental hx.    Pulmonary neg pulmonary ROS,    Pulmonary exam normal breath sounds clear to auscultation       Cardiovascular hypertension, negative cardio ROS Normal cardiovascular exam Rhythm:Regular Rate:Normal  The estimated ejection fraction was in the range of 50% to 55%   Neuro/Psych negative neurological ROS     GI/Hepatic negative GI ROS, Neg liver ROS,   Endo/Other  negative endocrine ROSdiabetes  Renal/GU negative Renal ROS     Musculoskeletal negative musculoskeletal ROS (+)   Abdominal   Peds  Hematology negative hematology ROS (+)   Anesthesia Other Findings Day of surgery medications reviewed with the patient.  Reproductive/Obstetrics                           Lab Results  Component Value Date   WBC 9.5 06/11/2017   HGB 15.5 06/11/2017   HCT 44.7 06/11/2017   MCV 92.9 06/11/2017   PLT 199 06/11/2017   Lab Results  Component Value Date   CREATININE 1.27 (H) 06/11/2017   BUN 20 06/11/2017   NA 138 06/11/2017   K 4.4 06/11/2017   CL 105 06/11/2017   CO2 21 (L) 06/11/2017     Anesthesia Physical Anesthesia Plan  ASA: II  Anesthesia Plan: General   Post-op Pain Management:    Induction: Intravenous  PONV Risk Score and Plan: 1 and Dexamethasone, Ondansetron, Treatment may vary due to age or medical condition and Midazolam  Airway Management Planned: Oral ETT  Additional Equipment:   Intra-op Plan:   Post-operative Plan: Extubation in OR  Informed Consent: I have reviewed the patients History and Physical, chart, labs and discussed the procedure including the risks, benefits and alternatives for the proposed anesthesia with the patient or authorized representative who  has indicated his/her understanding and acceptance.   Dental advisory given  Plan Discussed with: CRNA  Anesthesia Plan Comments:         Anesthesia Quick Evaluation

## 2017-06-13 NOTE — Patient Instructions (Signed)
Medication Instructions:   Your physician recommends that you continue on your current medications as directed. Please refer to the Current Medication list given to you today.   If you need a refill on your cardiac medications before your next appointment, please call your pharmacy.  Labwork: NONE ORDERED  TODAY     Testing/Procedures: NONE ORDERED  TODAY    Follow-Up:  Your physician wants you to follow-up in:  IN  [place] will receive a reminder letter in the mail two months in advance. If you don't receive a letter, please call our office to schedule the follow-up appointment.   Any Other Special Instructions Will Be Listed Below (If Applicable).  MAKE SURE YOU GET FASTING LABS FROM PRIMARY CARE PROVIDER IN 3 WEEKS.Marland Kitchen

## 2017-06-13 NOTE — Progress Notes (Signed)
Cardiology Office Note    Date:  06/13/2017   ID:  [name] [name], DOB July 19, 1950, MRN [id number]  PCP:  Celene Squibb, MD  Cardiologist: Dr. Sallyanne Kuster  Chief Complaint: Surgical clearance for anterior cervical fusion on 06/14/17  History of Present Illness:   [name] [name] is a 67 y.o. male mild to moderate CAD with superimposed nonischemic cardiomyopathy, chronic diastolic CHF,  hypertension, hyperlipidemia, diabetes and obesity presents for surgical clearance.  Has a history of moderate CAD (50-60% circumflex, 30% LAD by cath 2011). Reportedly there was no ostium for the RCA, which filled retrogradely as a branch of the LCX. EF was 45-50% by echo 2011, global hypokinesis. 2009 nuclear study showed apical ischemia, EF 53%.  Echo in December 2015 showed no change in EF (50-55%) and confirmed diastolic dysfunction without clear evidence for elevated filling pressure. Nuclear study performed on same visit showed normal perfusion , EF 43%.  Last seen by Dr. Sallyanne Kuster 10/2014.  Patient is here for preoperative clearance after greater than 2 years of absence.  Patient denies any chest pain, shortness of breath, orthopnea, PND, lower extremity edema, melena or blood in stool or urine.  Patient is still able to achieve at least 6.61 mets of activity per DASI despite significant orthopedic issue.   Past Medical History:  Diagnosis Date  . Anxiety   . Arthritis   . CAD (coronary artery disease) cath in 2011  . CAD in native artery 05/18/2014  . Cellulitis of toe, right july 2012  . Chronic diastolic heart failure ([place]) 05/18/2014  . Cirrhosis of liver ([place])    ? ETOH, fatty liver   . Depression   . Diabetes mellitus   . Hyperlipidemia 05/18/2014  . Hypertension   . Nonischemic cardiomyopathy ([place]) 05/18/2014    Past Surgical History:  Procedure Laterality Date  . CARDIAC CATHETERIZATION  05/24/2010  . Carotid duplex  05/23/2010  . CARPAL TUNNEL RELEASE    . CERVICAL SPINE SURGERY      . COLONOSCOPY  FEB 2010 ARS NUR [place]   NL TCS  . COLONOSCOPY N/A 08/01/2015   Dr. Oneida Alar: small internal hemorrhoids, next TCS in 10 years   . DOPPLER ECHOCARDIOGRAPHY  05/09/2010   EF 45-50%  . ESOPHAGOGASTRODUODENOSCOPY  02/07/2011   Dr. Oneida Alar: H.pylori gastritis, mild portal gastropathy, treatmetn with Amoxicillin/Biaxin   . ESOPHAGOGASTRODUODENOSCOPY N/A 08/01/2015   Dr. Oneida Alar: Reflux esophagitis, mild gastritis, next EGD in 3 years   . lower ext atrerial duplex  05/23/2010  . NM MYOVIEW LTD  01/08/2008   EF 53%  Low risk scan  . sleep study  01/01/2008    Current Medications:  Prior to Admission medications   Medication Sig Start Date End Date Taking? Authorizing Provider  aspirin 81 MG tablet Take 81 mg by mouth daily.     Yes [provider]  carvedilol (COREG) 25 MG tablet Take 12.5 mg by mouth 2 (two) times daily with a meal.   Yes [provider]  desvenlafaxine (PRISTIQ) 100 MG 24 hr tablet Take 100 mg by mouth daily.   Yes [provider]  empagliflozin (JARDIANCE) 10 MG TABS tablet Take 10 mg by mouth daily.   Yes [provider]  fish oil-omega-3 fatty acids 1000 MG capsule Take 1 g by mouth daily.    Yes [provider]  fluocinonide cream (LIDEX) 2.22 % Apply 1 application topically daily.    Yes [provider]  gabapentin (NEURONTIN) 600 MG tablet Take  600 mg by mouth 3 (three) times daily.    Yes [provider]  insulin aspart (NOVOLOG) 100 UNIT/ML injection Inject 10 Units into the skin 3 (three) times daily before meals.   Yes [provider]  insulin glargine (LANTUS) 100 UNIT/ML injection Inject 50 Units into the skin 2 (two) times daily.    Yes [provider]  naproxen sodium (ALEVE) 220 MG tablet Take 220 mg by mouth daily as needed (for pain or headache).   Yes [provider]  pantoprazole (PROTONIX) 40 MG tablet Take 1 tablet (40 mg total) by mouth 2 (two) times daily  before a meal. 03/22/17  Yes Annitta Needs, NP  sildenafil (VIAGRA) 100 MG tablet Take 100 mg by mouth daily as needed for erectile dysfunction.   Yes [provider]  traZODone (DESYREL) 100 MG tablet Take 200 mg by mouth at bedtime.    Yes [provider]     Allergies:   Naprosyn [naproxen]   Social History   Socioeconomic History  . Marital status: Single    Spouse name: None  . Number of children: None  . Years of education: None  . Highest education level: None  Social Needs  . Financial resource strain: None  . Food insecurity - worry: None  . Food insecurity - inability: None  . Transportation needs - medical: None  . Transportation needs - non-medical: None  Occupational History  . None  Tobacco Use  . Smoking status: Never Smoker  . Smokeless tobacco: Never Used  Substance and Sexual Activity  . Alcohol use: Yes    Alcohol/week: 1.8 oz    Types: 3 Cans of beer per week    Comment: very rare alcohol use-a beer maybe every 6 months   . Drug use: No    Comment: as a young teen  . Sexual activity: None  Other Topics Concern  . None  Social History Narrative  . None     Family History:  The patient's family history is not on file.   ROS:   Please see the history of present illness.    ROS All other systems reviewed and are negative.   PHYSICAL EXAM:   VS:  BP (!) 148/78   Pulse 78   Resp 16   Ht 5\' 5"  (1.651 m)   Wt 195 lb 6.4 oz (88.6 kg)   SpO2 98%   BMI 32.52 kg/m    GEN: Well nourished, well developed, in no acute distress  HEENT: normal  Neck: no JVD, carotid bruits, or masses Cardiac: RRR; no murmurs, rubs, or gallops,no edema  Respiratory:  clear to auscultation bilaterally, normal work of breathing GI: soft, nontender, nondistended, + BS MS: no deformity or atrophy  Skin: warm and dry, no rash Neuro:  Alert and Oriented x 3, Strength and sensation are intact Psych: euthymic mood, full affect  Wt Readings from Last 3  Encounters:  06/13/17 195 lb 6.4 oz (88.6 kg)  06/11/17 195 lb 12.8 oz (88.8 kg)  03/22/17 200 lb (90.7 kg)      Studies/Labs Reviewed:   EKG:  EKG is not ordered today.   EKG 06/11/17: Normal sinus rhythm 69 bpm.  No acute changes - personally reviewed.  Recent Labs: 06/11/2017: ALT 28; BUN 20; Creatinine, Ser 1.27; Hemoglobin 15.5; Platelets 199; Potassium 4.4; Sodium 138   Lipid Panel    Component Value Date/Time   CHOL 153 11/26/2010 0523   TRIG 252 (H) 11/26/2010 1914  HDL 25 (L) 11/26/2010 0523   CHOLHDL 6.1 11/26/2010 0523   VLDL 50 (H) 11/26/2010 0523   LDLCALC 78 11/26/2010 0523    Additional studies/ records that were reviewed today include:   Echocardiogram: 04/2014 Study Conclusions  - Left ventricle: The cavity size was normal. Wall thickness was normal. Systolic function was normal. The estimated ejection fraction was in the range of 50% to 55%. Doppler parameters are consistent with abnormal left ventricular relaxation (grade 1 diastolic dysfunction). - Mitral valve: There was mild regurgitation.   Stress test 04/2014 Impression Exercise Capacity:  Lexiscan with no exercise. BP Response:  Normal blood pressure response. Clinical Symptoms:  No significant symptoms noted. ECG Impression:  No significant ST segment change suggestive of ischemia. Comparison with Prior Nuclear Study: No images to compare  Overall Impression:  Low risk stress nuclear study Apical thinning.  LV Wall Motion:  Mild to moderate global LV dysfunction    ASSESSMENT & PLAN:    1. CAD - LAD 30% % CX 50% by cath in 2011. Normal stress test in 04/2014.  Continue aspirin and coreg.   2. Chronic diastolic CHF - Euvolemic. Continue BB. No longer on lisinopril -->the patient is unable to provide information..   3. HTN -Blood pressure of 148/78 today.  It was normal 127/68 during preop visit.  Advised to keep a log.  Continue Coreg for now.  4. HLD -The patient  will get lipid check by PCP (denied today) in few weeks.  Recommended low-dose statin after  Labs given history of CAD and diabetes.  5.  Preop cardiovascular clearance - DASI is 6.61.  No angina or CHF symptoms. Given past medical history and tbased on ACC/AHA guidelines, [name] [name] would be at acceptable risk for the planned procedure without further cardiovascular testing. Last dose of ASA 06/06/17.     Medication Adjustments/Labs and Tests Ordered: Current medicines are reviewed at length with the patient today.  Concerns regarding medicines are outlined above.  Medication changes, Labs and Tests ordered today are listed in the Patient Instructions below. Patient Instructions  Medication Instructions:   Your physician recommends that you continue on your current medications as directed. Please refer to the Current Medication list given to you today.   If you need a refill on your cardiac medications before your next appointment, please call your pharmacy.  Labwork: NONE ORDERED  TODAY     Testing/Procedures: NONE ORDERED  TODAY    Follow-Up:  Your physician wants you to follow-up in:  [place]  [place] will receive a reminder letter in the mail two months in advance. If you don't receive a letter, please call our office to schedule the follow-up appointment.   Any Other Special Instructions Will Be Listed Below (If Applicable).  MAKE SURE YOU GET FASTING LABS FROM PRIMARY CARE PROVIDER IN 3 WEEKS.Marland Kitchen  Jarrett Soho, [place]  06/13/2017 11:57 AM    [facility] [place], [place], [place]  [address] Phone: [phone]; Fax: [phone]

## 2017-06-14 ENCOUNTER — Encounter (HOSPITAL_COMMUNITY): Payer: Self-pay | Admitting: *Deleted

## 2017-06-14 ENCOUNTER — Encounter (HOSPITAL_COMMUNITY)
Admission: RE | Disposition: A | Discharge status: Home / Self Care | Payer: Self-pay | Source: Ambulatory Visit | Attending: Neurosurgery

## 2017-06-14 ENCOUNTER — Other Ambulatory Visit: Payer: Self-pay

## 2017-06-14 ENCOUNTER — Ambulatory Visit (HOSPITAL_COMMUNITY): Payer: PPO | Admitting: Anesthesiology

## 2017-06-14 ENCOUNTER — Ambulatory Visit (HOSPITAL_COMMUNITY): Payer: PPO | Admitting: Emergency Medicine

## 2017-06-14 ENCOUNTER — Ambulatory Visit (HOSPITAL_COMMUNITY): Payer: PPO

## 2017-06-14 ENCOUNTER — Inpatient Hospital Stay (HOSPITAL_COMMUNITY)
Admission: RE | Admit: 2017-06-14 | Discharge: 2017-06-15 | DRG: 472 | Disposition: A | Discharge status: Home / Self Care | Payer: PPO | Source: Ambulatory Visit | Attending: Neurosurgery | Admitting: Neurosurgery

## 2017-06-14 DIAGNOSIS — I428 Other cardiomyopathies: Secondary | ICD-10-CM | POA: Diagnosis present

## 2017-06-14 DIAGNOSIS — I110 Hypertensive heart disease with heart failure: Secondary | ICD-10-CM | POA: Diagnosis present

## 2017-06-14 DIAGNOSIS — K7460 Unspecified cirrhosis of liver: Secondary | ICD-10-CM | POA: Diagnosis present

## 2017-06-14 DIAGNOSIS — M2578 Osteophyte, vertebrae: Secondary | ICD-10-CM | POA: Diagnosis present

## 2017-06-14 DIAGNOSIS — M4802 Spinal stenosis, cervical region: Secondary | ICD-10-CM | POA: Diagnosis present

## 2017-06-14 DIAGNOSIS — Z79899 Other long term (current) drug therapy: Secondary | ICD-10-CM | POA: Diagnosis not present

## 2017-06-14 DIAGNOSIS — I2510 Atherosclerotic heart disease of native coronary artery without angina pectoris: Secondary | ICD-10-CM | POA: Diagnosis present

## 2017-06-14 DIAGNOSIS — Z794 Long term (current) use of insulin: Secondary | ICD-10-CM | POA: Diagnosis not present

## 2017-06-14 DIAGNOSIS — F329 Major depressive disorder, single episode, unspecified: Secondary | ICD-10-CM | POA: Diagnosis present

## 2017-06-14 DIAGNOSIS — M4712 Other spondylosis with myelopathy, cervical region: Secondary | ICD-10-CM | POA: Diagnosis present

## 2017-06-14 DIAGNOSIS — Z7982 Long term (current) use of aspirin: Secondary | ICD-10-CM | POA: Diagnosis not present

## 2017-06-14 DIAGNOSIS — M50021 Cervical disc disorder at C4-C5 level with myelopathy: Secondary | ICD-10-CM | POA: Diagnosis present

## 2017-06-14 DIAGNOSIS — E785 Hyperlipidemia, unspecified: Secondary | ICD-10-CM | POA: Diagnosis present

## 2017-06-14 DIAGNOSIS — I5032 Chronic diastolic (congestive) heart failure: Secondary | ICD-10-CM | POA: Diagnosis present

## 2017-06-14 DIAGNOSIS — E119 Type 2 diabetes mellitus without complications: Secondary | ICD-10-CM | POA: Diagnosis present

## 2017-06-14 DIAGNOSIS — K210 Gastro-esophageal reflux disease with esophagitis: Secondary | ICD-10-CM | POA: Diagnosis not present

## 2017-06-14 DIAGNOSIS — M4322 Fusion of spine, cervical region: Secondary | ICD-10-CM | POA: Diagnosis not present

## 2017-06-14 DIAGNOSIS — Z886 Allergy status to analgesic agent status: Secondary | ICD-10-CM | POA: Diagnosis not present

## 2017-06-14 HISTORY — PX: ANTERIOR CERVICAL DECOMP/DISCECTOMY FUSION: SHX1161

## 2017-06-14 LAB — GLUCOSE, CAPILLARY
GLUCOSE-CAPILLARY: 200 mg/dL — AB (ref 65–99)
GLUCOSE-CAPILLARY: 222 mg/dL — AB (ref 65–99)
Glucose-Capillary: 125 mg/dL — ABNORMAL HIGH (ref 65–99)
Glucose-Capillary: 86 mg/dL (ref 65–99)
Glucose-Capillary: 130 mg/dL — ABNORMAL HIGH (ref 65–99)

## 2017-06-14 SURGERY — ANTERIOR CERVICAL DECOMPRESSION/DISCECTOMY FUSION 1 LEVEL
Anesthesia: General

## 2017-06-14 MED ORDER — MIDAZOLAM HCL 2 MG/2ML IJ SOLN
INTRAMUSCULAR | Status: AC
  Filled 2017-06-14: qty 2

## 2017-06-14 MED ORDER — CEFAZOLIN SODIUM-DEXTROSE 2-4 GM/100ML-% IV SOLN: 2.0000 g | INTRAVENOUS | Status: DC

## 2017-06-14 MED ORDER — PROMETHAZINE HCL 25 MG/ML IJ SOLN: 6.2500 mg | INTRAMUSCULAR | Status: DC | PRN

## 2017-06-14 MED ORDER — MEPERIDINE HCL 25 MG/ML IJ SOLN: 6.2500 mg | INTRAMUSCULAR | Status: DC | PRN

## 2017-06-14 MED ORDER — HYDROCODONE-ACETAMINOPHEN 7.5-325 MG PO TABS: 1.0000 | ORAL_TABLET | Freq: Once | ORAL | Status: DC | PRN

## 2017-06-14 MED ORDER — THROMBIN (RECOMBINANT) 5000 UNITS EX SOLR
CUTANEOUS | Status: DC | PRN
  Administered 2017-06-14 (×2): 5000 [IU] via TOPICAL

## 2017-06-14 MED ORDER — MIDAZOLAM HCL 5 MG/5ML IJ SOLN
INTRAMUSCULAR | Status: DC | PRN
  Administered 2017-06-14: 2 mg via INTRAVENOUS

## 2017-06-14 MED ORDER — ONDANSETRON HCL 4 MG/2ML IJ SOLN
INTRAMUSCULAR | Status: AC
  Filled 2017-06-14: qty 2

## 2017-06-14 MED ORDER — ONDANSETRON HCL 4 MG PO TABS: 4.0000 mg | ORAL_TABLET | Freq: Four times a day (QID) | ORAL | Status: DC | PRN

## 2017-06-14 MED ORDER — BACITRACIN 50000 UNITS IM SOLR
INTRAMUSCULAR | Status: DC | PRN
  Administered 2017-06-14: 08:00:00

## 2017-06-14 MED ORDER — CEFAZOLIN SODIUM-DEXTROSE 2-4 GM/100ML-% IV SOLN
INTRAVENOUS | Status: AC
  Filled 2017-06-14: qty 100

## 2017-06-14 MED ORDER — INSULIN ASPART 100 UNIT/ML ~~LOC~~ SOLN
0.0000 [IU] | Freq: Three times a day (TID) | SUBCUTANEOUS | Status: DC
  Administered 2017-06-14: 2 [IU] via SUBCUTANEOUS
  Administered 2017-06-14 – 2017-06-15 (×2): 3 [IU] via SUBCUTANEOUS

## 2017-06-14 MED ORDER — LIDOCAINE HCL (CARDIAC) 20 MG/ML IV SOLN
INTRAVENOUS | Status: DC | PRN
  Administered 2017-06-14: 100 mg via INTRAVENOUS

## 2017-06-14 MED ORDER — DEXAMETHASONE SODIUM PHOSPHATE 10 MG/ML IJ SOLN
INTRAMUSCULAR | Status: DC | PRN
  Administered 2017-06-14: 10 mg via INTRAVENOUS

## 2017-06-14 MED ORDER — 0.9 % SODIUM CHLORIDE (POUR BTL) OPTIME
TOPICAL | Status: DC | PRN
  Administered 2017-06-14: 1000 mL

## 2017-06-14 MED ORDER — FENTANYL CITRATE (PF) 100 MCG/2ML IJ SOLN
INTRAMUSCULAR | Status: DC | PRN
  Administered 2017-06-14 (×3): 50 ug via INTRAVENOUS

## 2017-06-14 MED ORDER — DEXAMETHASONE SODIUM PHOSPHATE 10 MG/ML IJ SOLN
INTRAMUSCULAR | Status: AC
  Filled 2017-06-14: qty 1

## 2017-06-14 MED ORDER — HYDROMORPHONE HCL 1 MG/ML IJ SOLN: 1.0000 mg | INTRAMUSCULAR | Status: DC | PRN

## 2017-06-14 MED ORDER — THROMBIN (RECOMBINANT) 5000 UNITS EX SOLR
CUTANEOUS | Status: AC
  Filled 2017-06-14: qty 10000

## 2017-06-14 MED ORDER — INSULIN GLARGINE 100 UNIT/ML ~~LOC~~ SOLN
50.0000 [IU] | Freq: Two times a day (BID) | SUBCUTANEOUS | Status: DC
  Administered 2017-06-14 – 2017-06-15 (×2): 50 [IU] via SUBCUTANEOUS
  Filled 2017-06-14 (×2): qty 0.5

## 2017-06-14 MED ORDER — PROPOFOL 10 MG/ML IV BOLUS
INTRAVENOUS | Status: DC | PRN
  Administered 2017-06-14: 150 mg via INTRAVENOUS

## 2017-06-14 MED ORDER — PHENYLEPHRINE 40 MCG/ML (10ML) SYRINGE FOR IV PUSH (FOR BLOOD PRESSURE SUPPORT)
PREFILLED_SYRINGE | INTRAVENOUS | Status: AC
  Filled 2017-06-14: qty 20

## 2017-06-14 MED ORDER — ACETAMINOPHEN 325 MG PO TABS: 650.0000 mg | ORAL_TABLET | ORAL | Status: DC | PRN

## 2017-06-14 MED ORDER — INSULIN ASPART 100 UNIT/ML ~~LOC~~ SOLN
10.0000 [IU] | Freq: Three times a day (TID) | SUBCUTANEOUS | Status: DC
  Administered 2017-06-14 – 2017-06-15 (×3): 10 [IU] via SUBCUTANEOUS

## 2017-06-14 MED ORDER — ACETAMINOPHEN 650 MG RE SUPP: 650.0000 mg | RECTAL | Status: DC | PRN

## 2017-06-14 MED ORDER — SUGAMMADEX SODIUM 200 MG/2ML IV SOLN
INTRAVENOUS | Status: AC
  Filled 2017-06-14: qty 2

## 2017-06-14 MED ORDER — TRAZODONE HCL 100 MG PO TABS
200.0000 mg | ORAL_TABLET | Freq: Every day | ORAL | Status: DC
  Administered 2017-06-14: 200 mg via ORAL
  Filled 2017-06-14: qty 2

## 2017-06-14 MED ORDER — OMEGA-3-ACID ETHYL ESTERS 1 G PO CAPS
1.0000 g | ORAL_CAPSULE | Freq: Every day | ORAL | Status: DC
  Administered 2017-06-15: 1 g via ORAL
  Filled 2017-06-14 (×2): qty 1

## 2017-06-14 MED ORDER — GABAPENTIN 600 MG PO TABS
600.0000 mg | ORAL_TABLET | Freq: Three times a day (TID) | ORAL | Status: DC
  Administered 2017-06-14 – 2017-06-15 (×3): 600 mg via ORAL
  Filled 2017-06-14 (×3): qty 1

## 2017-06-14 MED ORDER — ACETAMINOPHEN 10 MG/ML IV SOLN: 1000.0000 mg | Freq: Once | INTRAVENOUS | Status: DC | PRN

## 2017-06-14 MED ORDER — ONDANSETRON HCL 4 MG/2ML IJ SOLN
INTRAMUSCULAR | Status: DC | PRN
  Administered 2017-06-14: 4 mg via INTRAVENOUS

## 2017-06-14 MED ORDER — PHENYLEPHRINE 40 MCG/ML (10ML) SYRINGE FOR IV PUSH (FOR BLOOD PRESSURE SUPPORT)
PREFILLED_SYRINGE | INTRAVENOUS | Status: DC | PRN
  Administered 2017-06-14: 40 ug via INTRAVENOUS
  Administered 2017-06-14 (×5): 80 ug via INTRAVENOUS
  Administered 2017-06-14: 40 ug via INTRAVENOUS

## 2017-06-14 MED ORDER — CYCLOBENZAPRINE HCL 10 MG PO TABS
10.0000 mg | ORAL_TABLET | Freq: Three times a day (TID) | ORAL | Status: DC | PRN
  Administered 2017-06-14 – 2017-06-15 (×3): 10 mg via ORAL
  Filled 2017-06-14 (×3): qty 1

## 2017-06-14 MED ORDER — EPHEDRINE SULFATE-NACL 50-0.9 MG/10ML-% IV SOSY
PREFILLED_SYRINGE | INTRAVENOUS | Status: DC | PRN
  Administered 2017-06-14 (×2): 5 mg via INTRAVENOUS
  Administered 2017-06-14: 10 mg via INTRAVENOUS

## 2017-06-14 MED ORDER — LACTATED RINGERS IV SOLN: INTRAVENOUS | Status: DC | PRN

## 2017-06-14 MED ORDER — HEMOSTATIC AGENTS (NO CHARGE) OPTIME
TOPICAL | Status: DC | PRN
  Administered 2017-06-14: 1 via TOPICAL

## 2017-06-14 MED ORDER — HYDROCODONE-ACETAMINOPHEN 5-325 MG PO TABS
1.0000 | ORAL_TABLET | ORAL | Status: DC | PRN
  Administered 2017-06-15: 1 via ORAL
  Filled 2017-06-14: qty 1

## 2017-06-14 MED ORDER — PROPOFOL 10 MG/ML IV BOLUS
INTRAVENOUS | Status: AC
  Filled 2017-06-14: qty 20

## 2017-06-14 MED ORDER — ONDANSETRON HCL 4 MG/2ML IJ SOLN: 4.0000 mg | Freq: Four times a day (QID) | INTRAMUSCULAR | Status: DC | PRN

## 2017-06-14 MED ORDER — CARVEDILOL 12.5 MG PO TABS
12.5000 mg | ORAL_TABLET | Freq: Two times a day (BID) | ORAL | Status: DC
  Administered 2017-06-14 – 2017-06-15 (×2): 12.5 mg via ORAL
  Filled 2017-06-14 (×2): qty 1

## 2017-06-14 MED ORDER — HYDROMORPHONE HCL 1 MG/ML IJ SOLN
0.2500 mg | INTRAMUSCULAR | Status: DC | PRN
  Administered 2017-06-14: 0.5 mg via INTRAVENOUS

## 2017-06-14 MED ORDER — CEFAZOLIN SODIUM-DEXTROSE 1-4 GM/50ML-% IV SOLN
1.0000 g | Freq: Three times a day (TID) | INTRAVENOUS | Status: AC
  Administered 2017-06-14 (×2): 1 g via INTRAVENOUS
  Filled 2017-06-14 (×2): qty 50

## 2017-06-14 MED ORDER — FENTANYL CITRATE (PF) 250 MCG/5ML IJ SOLN
INTRAMUSCULAR | Status: AC
  Filled 2017-06-14: qty 5

## 2017-06-14 MED ORDER — HYDROMORPHONE HCL 1 MG/ML IJ SOLN
INTRAMUSCULAR | Status: AC
  Filled 2017-06-14: qty 1

## 2017-06-14 MED ORDER — VENLAFAXINE HCL ER 75 MG PO CP24
150.0000 mg | ORAL_CAPSULE | Freq: Every day | ORAL | Status: DC
  Administered 2017-06-15: 150 mg via ORAL
  Filled 2017-06-14: qty 2

## 2017-06-14 MED ORDER — CEFAZOLIN SODIUM-DEXTROSE 2-3 GM-%(50ML) IV SOLR
INTRAVENOUS | Status: DC | PRN
  Administered 2017-06-14: 2 g via INTRAVENOUS

## 2017-06-14 MED ORDER — OMEGA-3 FATTY ACIDS 1000 MG PO CAPS
1.0000 g | ORAL_CAPSULE | Freq: Every day | ORAL | Status: DC
Start: 2017-06-14 — End: 2017-06-14

## 2017-06-14 MED ORDER — SODIUM CHLORIDE 0.9% FLUSH: 3.0000 mL | INTRAVENOUS | Status: DC | PRN

## 2017-06-14 MED ORDER — SUGAMMADEX SODIUM 200 MG/2ML IV SOLN
INTRAVENOUS | Status: DC | PRN
  Administered 2017-06-14: 200 mg via INTRAVENOUS

## 2017-06-14 MED ORDER — PHENOL 1.4 % MT LIQD: 1.0000 | OROMUCOSAL | Status: DC | PRN

## 2017-06-14 MED ORDER — CANAGLIFLOZIN 100 MG PO TABS
100.0000 mg | ORAL_TABLET | Freq: Every day | ORAL | Status: DC
  Administered 2017-06-15: 100 mg via ORAL
  Filled 2017-06-14: qty 1

## 2017-06-14 MED ORDER — EPHEDRINE 5 MG/ML INJ
INTRAVENOUS | Status: AC
  Filled 2017-06-14: qty 10

## 2017-06-14 MED ORDER — ROCURONIUM BROMIDE 100 MG/10ML IV SOLN
INTRAVENOUS | Status: DC | PRN
  Administered 2017-06-14 (×2): 10 mg via INTRAVENOUS
  Administered 2017-06-14: 50 mg via INTRAVENOUS
  Administered 2017-06-14: 5 mg via INTRAVENOUS

## 2017-06-14 MED ORDER — HYDROCODONE-ACETAMINOPHEN 10-325 MG PO TABS
2.0000 | ORAL_TABLET | ORAL | Status: DC | PRN
  Administered 2017-06-14 – 2017-06-15 (×4): 2 via ORAL
  Filled 2017-06-14 (×4): qty 2

## 2017-06-14 MED ORDER — SODIUM CHLORIDE 0.9% FLUSH
3.0000 mL | Freq: Two times a day (BID) | INTRAVENOUS | Status: DC
  Administered 2017-06-14: 3 mL via INTRAVENOUS

## 2017-06-14 MED ORDER — MENTHOL 3 MG MT LOZG: 1.0000 | LOZENGE | OROMUCOSAL | Status: DC | PRN

## 2017-06-14 MED ORDER — LACTATED RINGERS IV SOLN
INTRAVENOUS | Status: DC | PRN
  Administered 2017-06-14 (×2): via INTRAVENOUS

## 2017-06-14 SURGICAL SUPPLY — 58 items
ADH SKN CLS APL DERMABOND .7 (GAUZE/BANDAGES/DRESSINGS) ×1
APL SKNCLS STERI-STRIP NONHPOA (GAUZE/BANDAGES/DRESSINGS) ×1
BAG DECANTER FOR FLEXI CONT (MISCELLANEOUS) ×3 IMPLANT
BENZOIN TINCTURE PRP APPL 2/3 (GAUZE/BANDAGES/DRESSINGS) ×3 IMPLANT
BIT DRILL 13 (BIT) ×1 IMPLANT
BIT DRILL 13MM (BIT) ×1
BUR MATCHSTICK NEURO 3.0 LAGG (BURR) ×3 IMPLANT
CAGE PEEK 7X14X11 (Cage) ×3 IMPLANT
CANISTER SUCT 3000ML PPV (MISCELLANEOUS) ×3 IMPLANT
CARTRIDGE OIL MAESTRO DRILL (MISCELLANEOUS) ×1 IMPLANT
CLOSURE WOUND 1/2 X4 (GAUZE/BANDAGES/DRESSINGS) ×1
DERMABOND ADVANCED (GAUZE/BANDAGES/DRESSINGS) ×2
DERMABOND ADVANCED .7 DNX12 (GAUZE/BANDAGES/DRESSINGS) IMPLANT
DIFFUSER DRILL AIR PNEUMATIC (MISCELLANEOUS) ×3 IMPLANT
DRAPE C-ARM 42X72 X-RAY (DRAPES) ×6 IMPLANT
DRAPE LAPAROTOMY 100X72 PEDS (DRAPES) ×3 IMPLANT
DRAPE MICROSCOPE LEICA (MISCELLANEOUS) ×3 IMPLANT
DRAPE POUCH INSTRU U-SHP 10X18 (DRAPES) ×3 IMPLANT
DRSG OPSITE POSTOP 4X6 (GAUZE/BANDAGES/DRESSINGS) ×2 IMPLANT
DURAPREP 6ML APPLICATOR 50/CS (WOUND CARE) ×3 IMPLANT
ELECT COATED BLADE 2.86 ST (ELECTRODE) ×3 IMPLANT
ELECT REM PT RETURN 9FT ADLT (ELECTROSURGICAL) ×3
ELECTRODE REM PT RTRN 9FT ADLT (ELECTROSURGICAL) ×1 IMPLANT
GAUZE SPONGE 4X4 12PLY STRL (GAUZE/BANDAGES/DRESSINGS) ×3 IMPLANT
GAUZE SPONGE 4X4 16PLY XRAY LF (GAUZE/BANDAGES/DRESSINGS) IMPLANT
GLOVE ECLIPSE 9.0 STRL (GLOVE) ×3 IMPLANT
GLOVE EXAM NITRILE LRG STRL (GLOVE) IMPLANT
GLOVE EXAM NITRILE XL STR (GLOVE) IMPLANT
GLOVE EXAM NITRILE XS STR PU (GLOVE) IMPLANT
GOWN STRL REUS W/ TWL LRG LVL3 (GOWN DISPOSABLE) IMPLANT
GOWN STRL REUS W/ TWL XL LVL3 (GOWN DISPOSABLE) IMPLANT
GOWN STRL REUS W/TWL 2XL LVL3 (GOWN DISPOSABLE) IMPLANT
GOWN STRL REUS W/TWL LRG LVL3 (GOWN DISPOSABLE)
GOWN STRL REUS W/TWL XL LVL3 (GOWN DISPOSABLE)
HALTER HD/CHIN CERV TRACTION D (MISCELLANEOUS) ×3 IMPLANT
KIT BASIN OR (CUSTOM PROCEDURE TRAY) ×3 IMPLANT
KIT ROOM TURNOVER OR (KITS) ×3 IMPLANT
NDL SPNL 20GX3.5 QUINCKE YW (NEEDLE) ×1 IMPLANT
NEEDLE SPNL 20GX3.5 QUINCKE YW (NEEDLE) ×3 IMPLANT
NS IRRIG 1000ML POUR BTL (IV SOLUTION) ×3 IMPLANT
OIL CARTRIDGE MAESTRO DRILL (MISCELLANEOUS) ×3
PACK LAMINECTOMY NEURO (CUSTOM PROCEDURE TRAY) ×3 IMPLANT
PAD ARMBOARD 7.5X6 YLW CONV (MISCELLANEOUS) ×9 IMPLANT
PLATE ELITE VISION 25MM (Plate) ×2 IMPLANT
RUBBERBAND STERILE (MISCELLANEOUS) ×6 IMPLANT
SCREW ST FIX 4 ATL 3120213 (Screw) ×8 IMPLANT
SPACER SPNL 11X14X7XPEEK CVD (Cage) IMPLANT
SPCR SPNL 11X14X7XPEEK CVD (Cage) ×1 IMPLANT
SPONGE INTESTINAL PEANUT (DISPOSABLE) ×3 IMPLANT
SPONGE SURGIFOAM ABS GEL SZ50 (HEMOSTASIS) ×3 IMPLANT
STRIP CLOSURE SKIN 1/2X4 (GAUZE/BANDAGES/DRESSINGS) ×2 IMPLANT
SUT VIC AB 3-0 SH 8-18 (SUTURE) ×3 IMPLANT
SUT VIC AB 4-0 RB1 18 (SUTURE) ×3 IMPLANT
TAPE CLOTH 4X10 WHT NS (GAUZE/BANDAGES/DRESSINGS) ×3 IMPLANT
TOWEL GREEN STERILE (TOWEL DISPOSABLE) ×3 IMPLANT
TOWEL GREEN STERILE FF (TOWEL DISPOSABLE) ×3 IMPLANT
TRAP SPECIMEN MUCOUS 40CC (MISCELLANEOUS) ×3 IMPLANT
WATER STERILE IRR 1000ML POUR (IV SOLUTION) ×3 IMPLANT

## 2017-06-14 NOTE — Anesthesia Postprocedure Evaluation (Signed)
Anesthesia Post Note  Patient: [name] [name]  Procedure(s) Performed: ANTERIOR CERVICAL DECOMPRESSION/DISCECTOMY FUSION CERVICAL 4- CERVICAL 5 (N/A )     Patient location during evaluation: PACU Anesthesia Type: General Level of consciousness: awake and alert Pain management: pain level controlled Vital Signs Assessment: post-procedure vital signs reviewed and stable Respiratory status: spontaneous breathing, nonlabored ventilation, respiratory function stable and patient connected to nasal cannula oxygen Cardiovascular status: blood pressure returned to baseline and stable Postop Assessment: no apparent nausea or vomiting Anesthetic complications: no    Last Vitals:  Vitals:   06/14/17 1105 06/14/17 1132  BP:  136/77  Pulse: 83 82  Resp: 17 16  Temp: (!) 36.3 C 36.9 C  SpO2: 98% 96%    Last Pain:  Vitals:   06/14/17 1045  TempSrc:   PainSc: [place]

## 2017-06-14 NOTE — Anesthesia Procedure Notes (Signed)
Procedure Name: Intubation Date/Time: 06/14/2017 8:24 AM Performed by: Gwyndolyn Saxon, CRNA Pre-anesthesia Checklist: Patient identified, Suction available, Emergency Drugs available, Patient being monitored and Timeout performed Patient Re-evaluated:Patient Re-evaluated prior to induction Oxygen Delivery Method: Circle system utilized Preoxygenation: Pre-oxygenation with 100% oxygen Induction Type: IV induction Ventilation: Mask ventilation without difficulty Laryngoscope Size: Miller and 2 Grade View: Grade I Tube type: Oral Tube size: 7.5 mm Number of attempts: 1 Placement Confirmation: ETT inserted through vocal cords under direct vision,  positive ETCO2,  CO2 detector and breath sounds checked- equal and bilateral Secured at: 23 cm Tube secured with: Tape Dental Injury: Teeth and Oropharynx as per pre-operative assessment

## 2017-06-14 NOTE — Transfer of Care (Signed)
Immediate Anesthesia Transfer of Care Note  Patient: [name] [name] DAY  Procedure(s) Performed: ANTERIOR CERVICAL DECOMPRESSION/DISCECTOMY FUSION CERVICAL 4- CERVICAL 5 (N/A )  Patient Location: PACU  Anesthesia Type:General  Level of Consciousness: awake, alert  and oriented  Airway & Oxygen Therapy: Patient Spontanous Breathing and Patient connected to face mask oxygen  Post-op Assessment: Report given to RN and Post -op Vital signs reviewed and stable  Post vital signs: Reviewed and stable  Last Vitals:  Vitals:   06/14/17 0604  BP: 134/89  Pulse: 74  Resp: 18  Temp: 36.9 C  SpO2: 95%    Last Pain:  Vitals:   06/14/17 0604  TempSrc: Oral      Patients Stated Pain Goal: 3 (17/51/02 5852)  Complications: No apparent anesthesia complications

## 2017-06-14 NOTE — Op Note (Signed)
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  Surgeon:Juston Goheen A.Cia Garretson, M.D.  Asst. Surgeon: Arnoldo Morale  Anesthesia: General  Indication: 67 year old male with progressive bilateral upper extremity numbness, paresthesias and electrical sensations consistent with a Lhermitte's type phenomenon.  Patient status post previous posterior cervical decompressive surgery at C5-6 many years ago.  Workup demonstrates evidence of a large calcified disc herniation with marked spinal cord compression at C4-5.  Patient presents now for anterior cervical decompression and fusion in hopes of improving his symptoms.  Operative note: After induction of anesthesia, patient position supine with neck slightly extended and held in place holder traction.  Anterior cervical region prepped and draped sterilely.  Incision made overlying C4-5.  Dissection performed on the right retractor placed.  Fluoroscopy used.  Level confirmed.  Disc space incised.  Discectomy performed using various instruments down to level the posterior annulus.  Microscope was then brought to the field used throughout the remainder of the discectomy.  Remaining aspects of annulus and osteophytes were removed down to the level of the posterior longitudinal ligament using high-speed drill.  Posterior logical is not elevated and resected in a piecemeal fashion.  Underlying thecal sac was identified.  A wide central decompression was then performed undercutting the bodies of C4 and C5.  Decompression then proceeded into each neural foramen.  Wide anterior foraminotomies were performed on the course exiting C5 nerve roots bilaterally.  Findings at this  level included a large partially calcified central disc herniation which was resected.  Wound is then irrigated fanlike solution.  [employer]onic anatomic peek cage packed with locally harvested autograft was then impacted into place and recessed slightly from the anterior cortical margin.  25 mm Atlantis anterior cervical plate was then placed over the C5 and C4 level.  This was secured in place using 13 mm fixed angle screws at both levels.  All 4 screws and final tightening found to be solidly within the bone.  Locking screws were engaged.  Final images reveal good position of the cage and the instrumentation at the proper operative level with normal alignment of the spine.  Wound was then irrigated one last time.  Hemostasis was assured.  Wounds and closed in a typical fashion.  Steri-Strips and sterile dressing were applied.  No apparent complications.  Patient tolerated the procedure well and he returns to the recovery room postop.

## 2017-06-14 NOTE — Progress Notes (Signed)
Orthopedic Tech Progress Note Patient Details:  [name] [name] 09-26-1950 [id number]  Ortho Devices Type of Ortho Device: Soft collar Ortho Device/Splint Interventions: Application   Post Interventions Patient Tolerated: Well Instructions Provided: Care of device   [place] Pink 06/14/2017, 10:55 AM

## 2017-06-14 NOTE — Brief Op Note (Signed)
06/14/2017  9:46 AM  PATIENT:  [name] [name]  67 y.o. male  PRE-OPERATIVE DIAGNOSIS:  SPINAL STENOSIS, CERVICAL REGION  POST-OPERATIVE DIAGNOSIS:  spinal stenosis, cervical region  PROCEDURE:  Procedure(s) with comments: ANTERIOR CERVICAL DECOMPRESSION/DISCECTOMY FUSION CERVICAL 4- CERVICAL 5 (N/A) - ANTERIOR CERVICAL DECOMPRESSION/DISCECTOMY FUSION CERVICAL 4- CERVICAL 5  SURGEON:  Surgeon(s) and Role:    * Earnie Larsson, MD - Primary    * Newman Pies, MD - Assisting  PHYSICIAN ASSISTANT:   ASSISTANTS:    ANESTHESIA:   general  EBL:  75 mL   BLOOD ADMINISTERED:none  DRAINS: none   LOCAL MEDICATIONS USED:  NONE  SPECIMEN:  No Specimen  DISPOSITION OF SPECIMEN:  N/A  COUNTS:  YES  TOURNIQUET:  * No tourniquets in log *  DICTATION: .Dragon Dictation  PLAN OF CARE: Admit to inpatient   PATIENT DISPOSITION:  PACU - hemodynamically stable.   Delay start of Pharmacological VTE agent (>24hrs) due to surgical blood loss or risk of bleeding: yes

## 2017-06-14 NOTE — H&P (Signed)
[name] [name] is an 67 y.o. male.   Chief Complaint: Weakness HPI: 67 year old male status post prior C5-6 posterior cervical decompression presents with progressive upper and lower extremity weakness.  Workup demonstrates evidence of marked cervical stenosis with a central disc herniation at C4-5.  Patient presents now for anterior cervical discectomy and fusion in hopes of improving his symptoms.  Past Medical History:  Diagnosis Date  . Anxiety   . Arthritis   . CAD (coronary artery disease) cath in 2011  . CAD in native artery 05/18/2014  . Cellulitis of toe, right july 2012  . Chronic diastolic heart failure ([place]) 05/18/2014  . Cirrhosis of liver ([place])    ? ETOH, fatty liver   . Depression   . Diabetes mellitus   . Hyperlipidemia 05/18/2014  . Hypertension   . Nonischemic cardiomyopathy ([place]) 05/18/2014    Past Surgical History:  Procedure Laterality Date  . CARDIAC CATHETERIZATION  05/24/2010  . Carotid duplex  05/23/2010  . CARPAL TUNNEL RELEASE    . CERVICAL SPINE SURGERY    . COLONOSCOPY  FEB 2010 ARS NUR [place]   NL TCS  . COLONOSCOPY N/A 08/01/2015   Dr. Oneida Alar: small internal hemorrhoids, next TCS in 10 years   . DOPPLER ECHOCARDIOGRAPHY  05/09/2010   EF 45-50%  . ESOPHAGOGASTRODUODENOSCOPY  02/07/2011   Dr. Oneida Alar: H.pylori gastritis, mild portal gastropathy, treatmetn with Amoxicillin/Biaxin   . ESOPHAGOGASTRODUODENOSCOPY N/A 08/01/2015   Dr. Oneida Alar: Reflux esophagitis, mild gastritis, next EGD in 3 years   . lower ext atrerial duplex  05/23/2010  . NM MYOVIEW LTD  01/08/2008   EF 53%  Low risk scan  . sleep study  01/01/2008    Family History  Problem Relation Age of Onset  . Colon cancer Neg Hx   . Liver disease Neg Hx    Social History:  reports that  has never smoked. he has never used smokeless tobacco. He reports that he drinks about 1.8 oz of alcohol per week. He reports that he does not use drugs.  Allergies:  Allergies  Allergen Reactions  . Naprosyn  [Naproxen] Hives    Medications Prior to Admission  Medication Sig Dispense Refill  . aspirin 81 MG tablet Take 81 mg by mouth daily.      . carvedilol (COREG) 25 MG tablet Take 12.5 mg by mouth 2 (two) times daily with a meal.    . desvenlafaxine (PRISTIQ) 100 MG 24 hr tablet Take 100 mg by mouth daily.    . empagliflozin (JARDIANCE) 10 MG TABS tablet Take 10 mg by mouth daily.    . fish oil-omega-3 fatty acids 1000 MG capsule Take 1 g by mouth daily.     [name] [name] (NEURONTIN) 600 MG tablet Take 600 mg by mouth 3 (three) times daily.     . insulin aspart (NOVOLOG) 100 UNIT/ML injection Inject 10 Units into the skin 3 (three) times daily before meals.    . insulin glargine (LANTUS) 100 UNIT/ML injection Inject 50 Units into the skin 2 (two) times daily.     . naproxen sodium (ALEVE) 220 MG tablet Take 220 mg by mouth daily as needed (for pain or headache).    . traZODone (DESYREL) 100 MG tablet Take 200 mg by mouth at bedtime.     . fluocinonide cream (LIDEX) 3.41 % Apply 1 application topically daily.     . pantoprazole (PROTONIX) 40 MG tablet Take 1 tablet (40 mg total) by mouth 2 (two) times daily before  a meal. 180 tablet 3  . sildenafil (VIAGRA) 100 MG tablet Take 100 mg by mouth daily as needed for erectile dysfunction.      Results for orders placed or performed during the hospital encounter of 06/14/17 (from the past 48 hour(s))  Glucose, capillary     Status: Abnormal   Collection Time: 06/14/17  6:01 AM  Result Value Ref Range   Glucose-Capillary 125 (H) 65 - 99 mg/dL   No results found.  Pertinent items noted in HPI and remainder of comprehensive ROS otherwise negative.  Blood pressure 134/89, pulse 74, temperature 98.5 F (36.9 C), temperature source Oral, resp. rate 18, SpO2 95 %.  Awake and alert.  He is oriented and appropriate.  Speech is fluent.  Judgment and insight are intact.  Motor examination reveals moderate weakness of his grips and intrinsic functions  bilaterally grading at 4/5.  He has mild weakness of his right sided triceps muscle group grading out at 4+/5.  He has weakness and spasticity of both lower extremities grading out at 4/5.  He has decreased sensation from C5 distally.  Deep tendon reflexes are hyperactive.  He has clonus in both ankles.  Examination head ears eyes nose throat is unremarkable her chest and abdomen benign.  Extremities are free from injury deformity. Assessment/Plan C4-5 central herniated nucleus pulposus with severe stenosis and spinal cord compression with resultant myelopathy.  Plan C4-5 anterior cervical discectomy and fusion with interbody peek cage, locally harvested autograft, and anterior plate instrumentation risks and benefits of been explained.  Patient wishes to proceed.  [name] 06/14/2017, 8:04 AM

## 2017-06-15 LAB — GLUCOSE, CAPILLARY: Glucose-Capillary: 163 mg/dL — ABNORMAL HIGH (ref 65–99)

## 2017-06-15 MED ORDER — HYDROCODONE-ACETAMINOPHEN 10-325 MG PO TABS: 1.0000 | ORAL_TABLET | ORAL | 0 refills | Status: DC | PRN

## 2017-06-15 MED ORDER — CYCLOBENZAPRINE HCL 10 MG PO TABS: 10.0000 mg | ORAL_TABLET | Freq: Three times a day (TID) | ORAL | 5 refills | Status: DC | PRN

## 2017-06-15 NOTE — Discharge Summary (Signed)
Physician Discharge Summary  Patient ID: RIDGELY ANASTACIO MRN: [id number] DOB/AGE: 1950-10-23 67 y.o.  Admit date: 06/14/2017 Discharge date: 06/15/2017  Admission Diagnoses: Cervical spondylosis C4-C5 with myelopathy  Discharge Diagnoses: Cervical spondylosis C4-C5 with myelopathy  Active Problems:   Cervical spinal stenosis   Discharged Condition: good  Hospital Course: Patient was admitted to undergo surgical decompression at C4-C5 tolerated procedure well. He is discharged home  Consults: None  Significant Diagnostic Studies: None  Treatments: surgery: Anterior cervical decompression fusion C4-C5  Discharge Exam: Blood pressure 125/81, pulse 93, temperature 98.3 F (36.8 C), temperature source Oral, resp. rate 18, height 5\' 5"  (1.651 m), weight 88.5 kg (195 lb), SpO2 97 %. Incision is clean and dry motor function is intact.  Disposition: 01-Home or Self Care  Discharge Instructions    Call MD for:  redness, tenderness, or signs of infection (pain, swelling, redness, odor or green/yellow discharge around incision site)   Complete by:  As directed    Call MD for:  severe uncontrolled pain   Complete by:  As directed    Call MD for:  temperature >100.4   Complete by:  As directed    Diet - low sodium heart healthy   Complete by:  As directed    Increase activity slowly   Complete by:  As directed         Signed: Griselle Rufer J 06/15/2017, 8:23 AM

## 2017-06-15 NOTE — Progress Notes (Signed)
Patient is discharged from room 3C05 at this time. Alert and in stable condition. IV site d/c'd and instructions read to patient with understanding verbalized. Left unit via wheelchair with all belongings at side. 

## 2017-06-15 NOTE — Discharge Instructions (Signed)

## 2017-06-17 ENCOUNTER — Encounter (HOSPITAL_COMMUNITY): Payer: Self-pay | Admitting: Neurosurgery

## 2017-07-09 DIAGNOSIS — M4802 Spinal stenosis, cervical region: Secondary | ICD-10-CM | POA: Diagnosis not present

## 2017-07-30 DIAGNOSIS — E782 Mixed hyperlipidemia: Secondary | ICD-10-CM | POA: Diagnosis not present

## 2017-07-30 DIAGNOSIS — E1122 Type 2 diabetes mellitus with diabetic chronic kidney disease: Secondary | ICD-10-CM | POA: Diagnosis not present

## 2017-07-31 DIAGNOSIS — M4802 Spinal stenosis, cervical region: Secondary | ICD-10-CM | POA: Diagnosis not present

## 2017-07-31 DIAGNOSIS — M47812 Spondylosis without myelopathy or radiculopathy, cervical region: Secondary | ICD-10-CM | POA: Diagnosis not present

## 2017-07-31 DIAGNOSIS — M5022 Other cervical disc displacement, mid-cervical region, unspecified level: Secondary | ICD-10-CM | POA: Diagnosis not present

## 2017-08-02 DIAGNOSIS — G4700 Insomnia, unspecified: Secondary | ICD-10-CM | POA: Diagnosis not present

## 2017-08-02 DIAGNOSIS — I129 Hypertensive chronic kidney disease with stage 1 through stage 4 chronic kidney disease, or unspecified chronic kidney disease: Secondary | ICD-10-CM | POA: Diagnosis not present

## 2017-08-02 DIAGNOSIS — E1122 Type 2 diabetes mellitus with diabetic chronic kidney disease: Secondary | ICD-10-CM | POA: Diagnosis not present

## 2017-08-02 DIAGNOSIS — N183 Chronic kidney disease, stage 3 (moderate): Secondary | ICD-10-CM | POA: Diagnosis not present

## 2017-08-02 DIAGNOSIS — R5383 Other fatigue: Secondary | ICD-10-CM | POA: Diagnosis not present

## 2017-08-02 DIAGNOSIS — M792 Neuralgia and neuritis, unspecified: Secondary | ICD-10-CM | POA: Diagnosis not present

## 2017-08-02 DIAGNOSIS — E782 Mixed hyperlipidemia: Secondary | ICD-10-CM | POA: Diagnosis not present

## 2017-08-02 DIAGNOSIS — E559 Vitamin D deficiency, unspecified: Secondary | ICD-10-CM | POA: Diagnosis not present

## 2017-08-02 DIAGNOSIS — K7460 Unspecified cirrhosis of liver: Secondary | ICD-10-CM | POA: Diagnosis not present

## 2017-08-02 DIAGNOSIS — R410 Disorientation, unspecified: Secondary | ICD-10-CM | POA: Diagnosis not present

## 2017-08-02 DIAGNOSIS — R109 Unspecified abdominal pain: Secondary | ICD-10-CM | POA: Diagnosis not present

## 2017-08-02 DIAGNOSIS — G9009 Other idiopathic peripheral autonomic neuropathy: Secondary | ICD-10-CM | POA: Diagnosis not present

## 2017-08-07 DIAGNOSIS — M4802 Spinal stenosis, cervical region: Secondary | ICD-10-CM | POA: Diagnosis not present

## 2017-08-08 ENCOUNTER — Other Ambulatory Visit (HOSPITAL_COMMUNITY): Payer: Self-pay | Admitting: Neurosurgery

## 2017-08-08 ENCOUNTER — Other Ambulatory Visit (HOSPITAL_COMMUNITY): Payer: Self-pay | Admitting: Internal Medicine

## 2017-08-08 ENCOUNTER — Ambulatory Visit (HOSPITAL_COMMUNITY)
Admission: RE | Admit: 2017-08-08 | Discharge: 2017-08-08 | Disposition: A | Discharge status: Home / Self Care | Payer: PPO | Source: Ambulatory Visit | Attending: Vascular Surgery | Admitting: Vascular Surgery

## 2017-08-08 DIAGNOSIS — M7989 Other specified soft tissue disorders: Secondary | ICD-10-CM | POA: Diagnosis not present

## 2017-08-08 DIAGNOSIS — R600 Localized edema: Secondary | ICD-10-CM

## 2017-08-29 DIAGNOSIS — M542 Cervicalgia: Secondary | ICD-10-CM | POA: Diagnosis not present

## 2017-08-29 DIAGNOSIS — R200 Anesthesia of skin: Secondary | ICD-10-CM | POA: Diagnosis not present

## 2017-08-29 DIAGNOSIS — R29898 Other symptoms and signs involving the musculoskeletal system: Secondary | ICD-10-CM | POA: Diagnosis not present

## 2017-08-29 DIAGNOSIS — G8929 Other chronic pain: Secondary | ICD-10-CM | POA: Diagnosis not present

## 2017-09-18 DIAGNOSIS — G629 Polyneuropathy, unspecified: Secondary | ICD-10-CM | POA: Diagnosis not present

## 2017-09-25 DIAGNOSIS — R21 Rash and other nonspecific skin eruption: Secondary | ICD-10-CM | POA: Diagnosis not present

## 2017-09-25 DIAGNOSIS — R5383 Other fatigue: Secondary | ICD-10-CM | POA: Diagnosis not present

## 2017-09-25 DIAGNOSIS — M25431 Effusion, right wrist: Secondary | ICD-10-CM | POA: Diagnosis not present

## 2017-09-25 DIAGNOSIS — M25432 Effusion, left wrist: Secondary | ICD-10-CM | POA: Diagnosis not present

## 2017-09-25 DIAGNOSIS — M5410 Radiculopathy, site unspecified: Secondary | ICD-10-CM | POA: Diagnosis not present

## 2017-09-25 DIAGNOSIS — M5030 Other cervical disc degeneration, unspecified cervical region: Secondary | ICD-10-CM | POA: Diagnosis not present

## 2017-09-25 DIAGNOSIS — Z6831 Body mass index (BMI) 31.0-31.9, adult: Secondary | ICD-10-CM | POA: Diagnosis not present

## 2017-09-25 DIAGNOSIS — E669 Obesity, unspecified: Secondary | ICD-10-CM | POA: Diagnosis not present

## 2017-10-23 DIAGNOSIS — M25431 Effusion, right wrist: Secondary | ICD-10-CM | POA: Diagnosis not present

## 2017-10-23 DIAGNOSIS — Z6831 Body mass index (BMI) 31.0-31.9, adult: Secondary | ICD-10-CM | POA: Diagnosis not present

## 2017-10-23 DIAGNOSIS — R5383 Other fatigue: Secondary | ICD-10-CM | POA: Diagnosis not present

## 2017-10-23 DIAGNOSIS — M5030 Other cervical disc degeneration, unspecified cervical region: Secondary | ICD-10-CM | POA: Diagnosis not present

## 2017-10-23 DIAGNOSIS — R21 Rash and other nonspecific skin eruption: Secondary | ICD-10-CM | POA: Diagnosis not present

## 2017-10-23 DIAGNOSIS — E669 Obesity, unspecified: Secondary | ICD-10-CM | POA: Diagnosis not present

## 2017-10-23 DIAGNOSIS — M150 Primary generalized (osteo)arthritis: Secondary | ICD-10-CM | POA: Diagnosis not present

## 2017-10-23 DIAGNOSIS — M5410 Radiculopathy, site unspecified: Secondary | ICD-10-CM | POA: Diagnosis not present

## 2017-10-24 ENCOUNTER — Encounter: Payer: Self-pay | Admitting: Neurology

## 2017-10-30 DIAGNOSIS — E1122 Type 2 diabetes mellitus with diabetic chronic kidney disease: Secondary | ICD-10-CM | POA: Diagnosis not present

## 2017-10-30 DIAGNOSIS — E782 Mixed hyperlipidemia: Secondary | ICD-10-CM | POA: Diagnosis not present

## 2017-11-01 DIAGNOSIS — E1122 Type 2 diabetes mellitus with diabetic chronic kidney disease: Secondary | ICD-10-CM | POA: Diagnosis not present

## 2017-11-01 DIAGNOSIS — K7460 Unspecified cirrhosis of liver: Secondary | ICD-10-CM | POA: Diagnosis not present

## 2017-11-01 DIAGNOSIS — L719 Rosacea, unspecified: Secondary | ICD-10-CM | POA: Diagnosis not present

## 2017-11-01 DIAGNOSIS — G9009 Other idiopathic peripheral autonomic neuropathy: Secondary | ICD-10-CM | POA: Diagnosis not present

## 2017-11-01 DIAGNOSIS — Z6830 Body mass index (BMI) 30.0-30.9, adult: Secondary | ICD-10-CM | POA: Diagnosis not present

## 2017-11-01 DIAGNOSIS — N644 Mastodynia: Secondary | ICD-10-CM | POA: Diagnosis not present

## 2017-11-01 DIAGNOSIS — M25531 Pain in right wrist: Secondary | ICD-10-CM | POA: Diagnosis not present

## 2017-11-01 DIAGNOSIS — E782 Mixed hyperlipidemia: Secondary | ICD-10-CM | POA: Diagnosis not present

## 2017-11-01 DIAGNOSIS — M4322 Fusion of spine, cervical region: Secondary | ICD-10-CM | POA: Diagnosis not present

## 2017-11-01 DIAGNOSIS — I129 Hypertensive chronic kidney disease with stage 1 through stage 4 chronic kidney disease, or unspecified chronic kidney disease: Secondary | ICD-10-CM | POA: Diagnosis not present

## 2017-11-01 DIAGNOSIS — N183 Chronic kidney disease, stage 3 (moderate): Secondary | ICD-10-CM | POA: Diagnosis not present

## 2017-11-01 DIAGNOSIS — R2231 Localized swelling, mass and lump, right upper limb: Secondary | ICD-10-CM | POA: Diagnosis not present

## 2017-11-05 ENCOUNTER — Encounter: Payer: Self-pay | Admitting: Neurology

## 2017-11-05 ENCOUNTER — Other Ambulatory Visit (INDEPENDENT_AMBULATORY_CARE_PROVIDER_SITE_OTHER): Payer: PPO

## 2017-11-05 ENCOUNTER — Ambulatory Visit (INDEPENDENT_AMBULATORY_CARE_PROVIDER_SITE_OTHER): Payer: PPO | Admitting: Neurology

## 2017-11-05 VITALS — BP 120/80 | HR 76 | Ht 65.0 in | Wt 193.2 lb

## 2017-11-05 DIAGNOSIS — E1142 Type 2 diabetes mellitus with diabetic polyneuropathy: Secondary | ICD-10-CM | POA: Diagnosis not present

## 2017-11-05 DIAGNOSIS — M4802 Spinal stenosis, cervical region: Secondary | ICD-10-CM

## 2017-11-05 LAB — VITAMIN B12: Vitamin B-12: 408 pg/mL (ref 211–911)

## 2017-11-05 LAB — TSH: TSH: 4.18 u[IU]/mL (ref 0.35–4.50)

## 2017-11-05 LAB — FOLATE: FOLATE: 9.8 ng/mL (ref >5.9)

## 2017-11-05 NOTE — Patient Instructions (Addendum)
Check labs  If you develop any numbness or tingling of the hands and feet, please come back and see me

## 2017-11-05 NOTE — Progress Notes (Signed)
Between Neurology Division Clinic Note - Initial Visit   Date: 11/05/17  [name] [name] MRN: [id number] DOB: 25-Apr-1951   Dear Dr. Trudie Reed:  Thank you for your kind referral of [name] [name] for consultation of neuropathy. Although his history is well known to you, please allow [place] to reiterate it for the purpose of our medical record. The patient was accompanied to the clinic by self.   History of Present Illness: [name] [name] is a 67 y.o. 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    Late 2018, he started having tingling over the right thoracic region and breast which prompted him to get MRI thoracic spine which showed degenerative disc disease worse at T8-9 where there is right paracentral protrusion.  He also underwent CT myelogram of the cervical and thoracic region which showed severe central canal stenosis at C4-5 as well as multilevel degenerative changes. He was having neck pain, but denies any upper extremity weakness, numbness, or tingling.   He underwent ACFD at C4-5 in January 2018 by Dr. Trenton Gammon.  Post-operatively, he developed right wrist swelling, stiffness of the right thumb, and soreness of the thumb and wrist.  He did not have any numbness, tingling, or weakness of the right arm prior to his neck surgery.  In April, he had NCS/EMG of the upper extremities performed at [facility] by Dr. 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   He has been seeing Dr. 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  He has been referred to see me for evaluation of his neuropathy.    He does not have any paresthesia or weakness of the hands.  Occasionally, he has sharp shooting pain in the feet.  No weakness.  He endorses imbalance for the last year.  He  walks unassisted and does not use a cane.    Out-side paper records, electronic medical record, and images have been reviewed where available and summarized as:  NCS/EMG of the arms 08/29/2017:  Absent median, ulnar, and radial sensory responses.  Prolonged median motor latencies with forearm CV slowing (41 m/s), as well as ulnar motor nerve slowing along the course of the nerve (46 m/s).  Needle EMG is normal. 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  Lab Results  Component Value Date   TSH 3.76 07/14/2015   Lab Results  Component Value Date   HGBA1C 7.8 (H) 06/11/2017   CT cervical and thoracic myelogram 05/09/2017: 1. Laminectomy C5-6 and C6-7 with decompression of the central canal at both levels. 2. Mild residual left foraminal narrowing at C5-6. 3. Severe central and bilateral foraminal canal stenosis at C4-5. 4. Mild central and moderate foraminal narrowing at C3-4. 5. Mild central canal narrowing at C6-7. 6. Moderate right central canal narrowing at T8-9 again noted secondary to a calcified right paramedian central disc protrusion. 7. DISH. 8. Left foraminal stenosis at T4-5 due to uncovertebral spurring. 9. Right foraminal narrowing at T3-4, T4-5, and T5-6 due to facet spurring.  Past Medical History:  Diagnosis Date  . Anxiety   . Arthritis   . CAD (coronary artery disease) cath in 2011  . CAD in native artery 05/18/2014  . Cellulitis of toe, right july 2012  . Chronic diastolic heart failure ([place]) 05/18/2014  . Cirrhosis of liver ([place])    ? ETOH, fatty liver   .  Depression   . Diabetes mellitus   . Hyperlipidemia 05/18/2014  . Hypertension   . Nonischemic cardiomyopathy ([place]) 05/18/2014    Past Surgical History:  Procedure Laterality Date  . ANTERIOR CERVICAL DECOMP/DISCECTOMY FUSION N/A 06/14/2017   Procedure: ANTERIOR CERVICAL DECOMPRESSION/DISCECTOMY FUSION CERVICAL 4- CERVICAL 5;  Surgeon: Earnie Larsson, MD;   Location: [facility];  Service: Neurosurgery;  Laterality: N/A;  ANTERIOR CERVICAL DECOMPRESSION/DISCECTOMY FUSION CERVICAL 4- CERVICAL 5  . CARDIAC CATHETERIZATION  05/24/2010  . Carotid duplex  05/23/2010  . CARPAL TUNNEL RELEASE    . CERVICAL SPINE SURGERY    . COLONOSCOPY  FEB 2010 ARS NUR [place]   NL TCS  . COLONOSCOPY N/A 08/01/2015   Dr. Oneida Alar: small internal hemorrhoids, next TCS in 10 years   . DOPPLER ECHOCARDIOGRAPHY  05/09/2010   EF 45-50%  . ESOPHAGOGASTRODUODENOSCOPY  02/07/2011   Dr. Oneida Alar: H.pylori gastritis, mild portal gastropathy, treatmetn with Amoxicillin/Biaxin   . ESOPHAGOGASTRODUODENOSCOPY N/A 08/01/2015   Dr. Oneida Alar: Reflux esophagitis, mild gastritis, next EGD in 3 years   . lower ext atrerial duplex  05/23/2010  . NM MYOVIEW LTD  01/08/2008   EF 53%  Low risk scan  . sleep study  01/01/2008     Medications:  Outpatient Encounter Medications as of 11/05/2017  Medication Sig Note  . aspirin 81 MG tablet Take 81 mg by mouth daily.     . carvedilol (COREG) 25 MG tablet Take 12.5 mg by mouth 2 (two) times daily with a meal.   . cyclobenzaprine (FLEXERIL) 10 MG tablet Take 1 tablet (10 mg total) by mouth 3 (three) times daily as needed for muscle spasms.   Marland Kitchen desvenlafaxine (PRISTIQ) 100 MG 24 hr tablet Take 100 mg by mouth daily.   . empagliflozin (JARDIANCE) 10 MG TABS tablet Take 10 mg by mouth daily.   . fish oil-omega-3 fatty acids 1000 MG capsule Take 1 g by mouth daily.    . fluocinonide cream (LIDEX) 8.41 % Apply 1 application topically daily.    Marland Kitchen gabapentin (NEURONTIN) 600 MG tablet Take 600 mg by mouth 2 (two) times daily.    Marland Kitchen HYDROcodone-acetaminophen (NORCO) 10-325 MG tablet Take 1-2 tablets by mouth every 4 (four) hours as needed for severe pain ((score 7 to 10)).   Marland Kitchen insulin aspart (NOVOLOG) 100 UNIT/ML injection Inject 10 Units into the skin 3 (three) times daily before meals.   . insulin glargine (LANTUS) 100 UNIT/ML injection Inject 50 Units into the skin 2  (two) times daily.  06/14/2017: Pt. Took 25 units @ 0430  . naproxen sodium (ALEVE) 220 MG tablet Take 220 mg by mouth daily as needed (for pain or headache).   . pantoprazole (PROTONIX) 40 MG tablet Take 1 tablet (40 mg total) by mouth 2 (two) times daily before a meal.   . sildenafil (VIAGRA) 100 MG tablet Take 100 mg by mouth daily as needed for erectile dysfunction.   . traZODone (DESYREL) 100 MG tablet Take 200 mg by mouth at bedtime.     No facility-administered encounter medications on file as of 11/05/2017.     Allergies:  Allergies  Allergen Reactions  . Naprosyn [Naproxen] Hives    Family History: Family History  Problem Relation Age of Onset  . Cirrhosis Mother   . Heart attack Father   . Colon cancer Neg Hx   . Liver disease Neg Hx     Social History: Social History   Tobacco Use  . Smoking status: Never Smoker  .  Smokeless tobacco: Never Used  Substance Use Topics  . Alcohol use: Yes    Alcohol/week: 1.8 oz    Types: 3 Cans of beer per week    Comment: very rare alcohol use-a beer maybe every 6 months   . Drug use: No    Types: Marijuana    Comment: as a young teen   Social History   Social History Narrative  . Not on file    Review of Systems:  CONSTITUTIONAL: No fevers, chills, night sweats, or weight loss.   EYES: No visual changes or eye pain ENT: No hearing changes.  No history of nose bleeds.   RESPIRATORY: No cough, wheezing and shortness of breath.   CARDIOVASCULAR: Negative for chest pain, and palpitations.   GI: Negative for abdominal discomfort, blood in stools or black stools.  No recent change in bowel habits.   GU:  No history of incontinence.   MUSCLOSKELETAL: +history of joint pain or swelling.  No myalgias.   SKIN: Negative for lesions, rash, and itching.   HEMATOLOGY/ONCOLOGY: Negative for prolonged bleeding, bruising easily, and swollen nodes.  No history of cancer.   ENDOCRINE: Negative for cold or heat intolerance, polydipsia or  goiter.   PSYCH:  No depression or anxiety symptoms.   NEURO: As Above.   Vital Signs:  BP 120/80   Pulse 76   Ht 5\' 5"  (1.651 m)   Wt 193 lb 4 oz (87.7 kg)   SpO2 95%   BMI 32.16 kg/m    General Medical Exam:   General:  Well appearing, comfortable.   Eyes/ENT: see cranial nerve examination.   Neck: No masses appreciated.   No carotid bruits. Respiratory:  Clear to auscultation, good air entry bilaterally.   Cardiac:  Regular rate and rhythm, no murmur.   Extremities:  Bilateral pes cavus.  Range of motion with wrist extension and thumb extension is reduced due to pain and stiffness on the right.  No swelling.  Skin:  No rashes or lesions.  Neurological Exam: MENTAL STATUS including orientation to time, place, person, recent and remote memory, attention span and concentration, language, and fund of knowledge is normal.  Speech is not dysarthric.  CRANIAL NERVES: II:  No visual field defects.  Unremarkable fundi.   III-IV-VI: Pupils equal round and reactive to light.  Normal conjugate, extra-ocular eye movements in all directions of gaze.  No nystagmus.  No ptosis.   V:  Normal facial sensation.   VII:  Normal facial symmetry and movements.    VIII:  Normal hearing and vestibular function.   IX-X:  Normal palatal movement.   XI:  Normal shoulder shrug and head rotation.   XII:  Normal tongue strength and range of motion, no deviation or fasciculation.  MOTOR:  No atrophy, fasciculations or abnormal movements.  No pronator drift.  Tone is normal.    Right Upper Extremity:    Left Upper Extremity:    Deltoid  5/5   Deltoid  5/5   Biceps  5/5   Biceps  5/5   Triceps  5/5   Triceps  5/5   Wrist extensors  5/5   Wrist extensors  5/5   Wrist flexors  5/5   Wrist flexors  5/5   Finger extensors  5/5   Finger extensors  5/5   Finger flexors  5/5   Finger flexors  5/5   Dorsal interossei  5/5   Dorsal interossei  5/5   Abductor pollicis  5/5  Abductor pollicis  5/5   Tone  (Ashworth scale)  0  Tone (Ashworth scale)  0   Right Lower Extremity:    Left Lower Extremity:    Hip flexors  5/5   Hip flexors  5/5   Hip extensors  5/5   Hip extensors  5/5   Knee flexors  5/5   Knee flexors  5/5   Knee extensors  5/5   Knee extensors  5/5   Dorsiflexors  5/5   Dorsiflexors  5/5   Plantarflexors  5/5   Plantarflexors  5/5   Toe extensors  5/5   Toe extensors  5/5   Toe flexors  5/5   Toe flexors  5/5   Tone (Ashworth scale)  0+  Tone (Ashworth scale)  0+   MSRs:  Right                                                                 Left brachioradialis 2+  brachioradialis 2+  biceps 2+  biceps 2+  triceps 2+  triceps 2+  patellar 3+  patellar 3+  ankle jerk 2+  ankle jerk 2+  Hoffman no  Hoffman no  plantar response down  plantar response down   SENSORY:  Normal and symmetric perception of light touch, pinprick, vibration, and proprioception.  Romberg's sign absent.   COORDINATION/GAIT: Normal finger-to- nose-finger.  Intact rapid alternating movements bilaterally. Gait narrow based and stable. Stressed gait intact.  Unsteady with tandem gait.   IMPRESSION: Mr. Starace is a 67 year-old man referred for evaluation of neuropathy.  His EDX testing from April by Dr. Brien Few shows sensorimotor polyneuropathy with axon loss and demyelinating features.  Suprisingly, patient denies any numbness, tingling, or weakness of his hands or feet.  I offered to repeat NCS/EMG of the right arm and leg, to assess for a length dependent process, however, because he does not have neurological symptoms, he does not want to do additional testing. Specifically, there are no signs of a polyradiculoneuropathy (CIDP) on his EMG or exam (reflexes are brisk and intact, needle EMG did not show active denervation).  Although he denies symptoms of neuropathy, his diabetes certainly predisposes him to this and I suspect that he has subclinical features. Mixed of demyelinating and axonal loss neuropathy  is seen in patients with diabetes.  To be complete, I will check a few additional labs including vitamin B12, copper, folate, and TSH.  If he develops symptomatic neuropathy, repeat NCS/EMG would be indicated.   His gait unsteadiness seems to be stemming from residual cervical myelopathy, as he continues to have mild spasticity in the legs.  He takes flexeril 10mg  at bedtime for this.  I encouraged him to continue home stretching exercises.   Return to clinic as needed   Thank you for allowing me to participate in patient's care.  If I can answer any additional questions, I would be pleased to do so.    Sincerely,    Amour Trigg K. Posey Pronto, DO

## 2017-11-07 ENCOUNTER — Telehealth: Payer: Self-pay | Admitting: *Deleted

## 2017-11-07 LAB — COPPER, SERUM: Copper: 104 ug/dL (ref 70–175)

## 2017-11-07 NOTE — Telephone Encounter (Signed)
Patient given results

## 2017-11-07 NOTE — Telephone Encounter (Signed)
-----   Message from Alda Berthold, DO sent at 11/07/2017  9:04 AM EDT ----- Please notify patient lab are within normal limits.  Thank you.

## 2017-12-26 DIAGNOSIS — L309 Dermatitis, unspecified: Secondary | ICD-10-CM | POA: Diagnosis not present

## 2017-12-26 DIAGNOSIS — L570 Actinic keratosis: Secondary | ICD-10-CM | POA: Diagnosis not present

## 2017-12-26 DIAGNOSIS — L719 Rosacea, unspecified: Secondary | ICD-10-CM | POA: Diagnosis not present

## 2018-01-06 DIAGNOSIS — M5030 Other cervical disc degeneration, unspecified cervical region: Secondary | ICD-10-CM | POA: Diagnosis not present

## 2018-01-06 DIAGNOSIS — L4050 Arthropathic psoriasis, unspecified: Secondary | ICD-10-CM | POA: Diagnosis not present

## 2018-01-06 DIAGNOSIS — E669 Obesity, unspecified: Secondary | ICD-10-CM | POA: Diagnosis not present

## 2018-01-06 DIAGNOSIS — M150 Primary generalized (osteo)arthritis: Secondary | ICD-10-CM | POA: Diagnosis not present

## 2018-01-06 DIAGNOSIS — R5383 Other fatigue: Secondary | ICD-10-CM | POA: Diagnosis not present

## 2018-01-06 DIAGNOSIS — M5410 Radiculopathy, site unspecified: Secondary | ICD-10-CM | POA: Diagnosis not present

## 2018-01-06 DIAGNOSIS — Z6832 Body mass index (BMI) 32.0-32.9, adult: Secondary | ICD-10-CM | POA: Diagnosis not present

## 2018-01-06 DIAGNOSIS — R21 Rash and other nonspecific skin eruption: Secondary | ICD-10-CM | POA: Diagnosis not present

## 2018-01-06 DIAGNOSIS — M25431 Effusion, right wrist: Secondary | ICD-10-CM | POA: Diagnosis not present

## 2018-01-23 DIAGNOSIS — M542 Cervicalgia: Secondary | ICD-10-CM | POA: Diagnosis not present

## 2018-01-23 DIAGNOSIS — R29898 Other symptoms and signs involving the musculoskeletal system: Secondary | ICD-10-CM | POA: Diagnosis not present

## 2018-01-23 DIAGNOSIS — Z6831 Body mass index (BMI) 31.0-31.9, adult: Secondary | ICD-10-CM | POA: Diagnosis not present

## 2018-02-07 ENCOUNTER — Encounter

## 2018-02-07 ENCOUNTER — Ambulatory Visit: Payer: PPO | Admitting: Neurology

## 2018-02-19 DIAGNOSIS — M5410 Radiculopathy, site unspecified: Secondary | ICD-10-CM | POA: Diagnosis not present

## 2018-02-19 DIAGNOSIS — M25431 Effusion, right wrist: Secondary | ICD-10-CM | POA: Diagnosis not present

## 2018-02-19 DIAGNOSIS — E669 Obesity, unspecified: Secondary | ICD-10-CM | POA: Diagnosis not present

## 2018-02-19 DIAGNOSIS — Z6832 Body mass index (BMI) 32.0-32.9, adult: Secondary | ICD-10-CM | POA: Diagnosis not present

## 2018-02-19 DIAGNOSIS — R21 Rash and other nonspecific skin eruption: Secondary | ICD-10-CM | POA: Diagnosis not present

## 2018-02-19 DIAGNOSIS — L4050 Arthropathic psoriasis, unspecified: Secondary | ICD-10-CM | POA: Diagnosis not present

## 2018-02-19 DIAGNOSIS — R5383 Other fatigue: Secondary | ICD-10-CM | POA: Diagnosis not present

## 2018-02-19 DIAGNOSIS — M5030 Other cervical disc degeneration, unspecified cervical region: Secondary | ICD-10-CM | POA: Diagnosis not present

## 2018-02-19 DIAGNOSIS — M150 Primary generalized (osteo)arthritis: Secondary | ICD-10-CM | POA: Diagnosis not present

## 2018-04-03 DIAGNOSIS — M5030 Other cervical disc degeneration, unspecified cervical region: Secondary | ICD-10-CM | POA: Diagnosis not present

## 2018-04-03 DIAGNOSIS — M5410 Radiculopathy, site unspecified: Secondary | ICD-10-CM | POA: Diagnosis not present

## 2018-04-03 DIAGNOSIS — L4050 Arthropathic psoriasis, unspecified: Secondary | ICD-10-CM | POA: Diagnosis not present

## 2018-04-03 DIAGNOSIS — R5383 Other fatigue: Secondary | ICD-10-CM | POA: Diagnosis not present

## 2018-04-03 DIAGNOSIS — Z6833 Body mass index (BMI) 33.0-33.9, adult: Secondary | ICD-10-CM | POA: Diagnosis not present

## 2018-04-03 DIAGNOSIS — M150 Primary generalized (osteo)arthritis: Secondary | ICD-10-CM | POA: Diagnosis not present

## 2018-04-03 DIAGNOSIS — R21 Rash and other nonspecific skin eruption: Secondary | ICD-10-CM | POA: Diagnosis not present

## 2018-04-03 DIAGNOSIS — E669 Obesity, unspecified: Secondary | ICD-10-CM | POA: Diagnosis not present

## 2018-04-03 DIAGNOSIS — M25431 Effusion, right wrist: Secondary | ICD-10-CM | POA: Diagnosis not present

## 2018-06-03 DIAGNOSIS — R5383 Other fatigue: Secondary | ICD-10-CM | POA: Diagnosis not present

## 2018-06-03 DIAGNOSIS — M25431 Effusion, right wrist: Secondary | ICD-10-CM | POA: Diagnosis not present

## 2018-06-03 DIAGNOSIS — R21 Rash and other nonspecific skin eruption: Secondary | ICD-10-CM | POA: Diagnosis not present

## 2018-06-03 DIAGNOSIS — Z6833 Body mass index (BMI) 33.0-33.9, adult: Secondary | ICD-10-CM | POA: Diagnosis not present

## 2018-06-03 DIAGNOSIS — M5030 Other cervical disc degeneration, unspecified cervical region: Secondary | ICD-10-CM | POA: Diagnosis not present

## 2018-06-03 DIAGNOSIS — L4050 Arthropathic psoriasis, unspecified: Secondary | ICD-10-CM | POA: Diagnosis not present

## 2018-06-03 DIAGNOSIS — M150 Primary generalized (osteo)arthritis: Secondary | ICD-10-CM | POA: Diagnosis not present

## 2018-06-03 DIAGNOSIS — E669 Obesity, unspecified: Secondary | ICD-10-CM | POA: Diagnosis not present

## 2018-06-03 DIAGNOSIS — M5410 Radiculopathy, site unspecified: Secondary | ICD-10-CM | POA: Diagnosis not present

## 2018-06-16 DIAGNOSIS — H1033 Unspecified acute conjunctivitis, bilateral: Secondary | ICD-10-CM | POA: Diagnosis not present

## 2018-08-01 DIAGNOSIS — R5383 Other fatigue: Secondary | ICD-10-CM | POA: Diagnosis not present

## 2018-08-01 DIAGNOSIS — E669 Obesity, unspecified: Secondary | ICD-10-CM | POA: Diagnosis not present

## 2018-08-01 DIAGNOSIS — M150 Primary generalized (osteo)arthritis: Secondary | ICD-10-CM | POA: Diagnosis not present

## 2018-08-01 DIAGNOSIS — L4050 Arthropathic psoriasis, unspecified: Secondary | ICD-10-CM | POA: Diagnosis not present

## 2018-08-01 DIAGNOSIS — Z6832 Body mass index (BMI) 32.0-32.9, adult: Secondary | ICD-10-CM | POA: Diagnosis not present

## 2018-08-01 DIAGNOSIS — M5410 Radiculopathy, site unspecified: Secondary | ICD-10-CM | POA: Diagnosis not present

## 2018-08-01 DIAGNOSIS — M25431 Effusion, right wrist: Secondary | ICD-10-CM | POA: Diagnosis not present

## 2018-08-01 DIAGNOSIS — R21 Rash and other nonspecific skin eruption: Secondary | ICD-10-CM | POA: Diagnosis not present

## 2018-08-01 DIAGNOSIS — M5030 Other cervical disc degeneration, unspecified cervical region: Secondary | ICD-10-CM | POA: Diagnosis not present

## 2018-08-05 ENCOUNTER — Other Ambulatory Visit: Payer: Self-pay | Admitting: Rheumatology

## 2018-08-05 DIAGNOSIS — M25431 Effusion, right wrist: Secondary | ICD-10-CM

## 2018-08-06 ENCOUNTER — Encounter: Payer: Self-pay | Admitting: Gastroenterology

## 2018-08-06 ENCOUNTER — Ambulatory Visit
Admission: RE | Admit: 2018-08-06 | Discharge: 2018-08-06 | Disposition: A | Discharge status: Home / Self Care | Payer: PPO | Source: Ambulatory Visit | Attending: Rheumatology | Admitting: Rheumatology

## 2018-08-06 ENCOUNTER — Other Ambulatory Visit: Payer: Self-pay

## 2018-08-06 DIAGNOSIS — M25431 Effusion, right wrist: Secondary | ICD-10-CM | POA: Diagnosis not present

## 2018-08-06 MED ORDER — GADOBENATE DIMEGLUMINE 529 MG/ML IV SOLN
20.0000 mL | Freq: Once | INTRAVENOUS | Status: AC | PRN
  Administered 2018-08-06: 20 mL via INTRAVENOUS

## 2018-08-13 DIAGNOSIS — L4050 Arthropathic psoriasis, unspecified: Secondary | ICD-10-CM | POA: Diagnosis not present

## 2018-08-13 DIAGNOSIS — M150 Primary generalized (osteo)arthritis: Secondary | ICD-10-CM | POA: Diagnosis not present

## 2018-08-13 DIAGNOSIS — E669 Obesity, unspecified: Secondary | ICD-10-CM | POA: Diagnosis not present

## 2018-08-13 DIAGNOSIS — M5030 Other cervical disc degeneration, unspecified cervical region: Secondary | ICD-10-CM | POA: Diagnosis not present

## 2018-08-13 DIAGNOSIS — M25431 Effusion, right wrist: Secondary | ICD-10-CM | POA: Diagnosis not present

## 2018-08-13 DIAGNOSIS — R5383 Other fatigue: Secondary | ICD-10-CM | POA: Diagnosis not present

## 2018-08-13 DIAGNOSIS — M5410 Radiculopathy, site unspecified: Secondary | ICD-10-CM | POA: Diagnosis not present

## 2018-08-13 DIAGNOSIS — Z6833 Body mass index (BMI) 33.0-33.9, adult: Secondary | ICD-10-CM | POA: Diagnosis not present

## 2018-08-13 DIAGNOSIS — R21 Rash and other nonspecific skin eruption: Secondary | ICD-10-CM | POA: Diagnosis not present

## 2018-09-08 DIAGNOSIS — G5621 Lesion of ulnar nerve, right upper limb: Secondary | ICD-10-CM | POA: Diagnosis not present

## 2018-09-08 DIAGNOSIS — E119 Type 2 diabetes mellitus without complications: Secondary | ICD-10-CM | POA: Diagnosis not present

## 2018-09-08 DIAGNOSIS — R072 Precordial pain: Secondary | ICD-10-CM | POA: Diagnosis not present

## 2018-09-08 DIAGNOSIS — M4802 Spinal stenosis, cervical region: Secondary | ICD-10-CM | POA: Diagnosis not present

## 2018-09-08 DIAGNOSIS — J069 Acute upper respiratory infection, unspecified: Secondary | ICD-10-CM | POA: Diagnosis not present

## 2018-09-08 DIAGNOSIS — N183 Chronic kidney disease, stage 3 (moderate): Secondary | ICD-10-CM | POA: Diagnosis not present

## 2018-09-08 DIAGNOSIS — R05 Cough: Secondary | ICD-10-CM | POA: Diagnosis not present

## 2018-09-08 DIAGNOSIS — G629 Polyneuropathy, unspecified: Secondary | ICD-10-CM | POA: Diagnosis not present

## 2018-09-10 DIAGNOSIS — G5621 Lesion of ulnar nerve, right upper limb: Secondary | ICD-10-CM | POA: Diagnosis not present

## 2018-09-10 DIAGNOSIS — D1721 Benign lipomatous neoplasm of skin and subcutaneous tissue of right arm: Secondary | ICD-10-CM | POA: Diagnosis not present

## 2018-09-15 DIAGNOSIS — M4802 Spinal stenosis, cervical region: Secondary | ICD-10-CM | POA: Diagnosis not present

## 2018-09-15 DIAGNOSIS — Z6831 Body mass index (BMI) 31.0-31.9, adult: Secondary | ICD-10-CM | POA: Diagnosis not present

## 2018-09-15 DIAGNOSIS — G629 Polyneuropathy, unspecified: Secondary | ICD-10-CM | POA: Diagnosis not present

## 2018-09-15 DIAGNOSIS — G5621 Lesion of ulnar nerve, right upper limb: Secondary | ICD-10-CM | POA: Diagnosis not present

## 2018-10-07 DIAGNOSIS — Z0000 Encounter for general adult medical examination without abnormal findings: Secondary | ICD-10-CM | POA: Diagnosis not present

## 2018-10-09 DIAGNOSIS — M961 Postlaminectomy syndrome, not elsewhere classified: Secondary | ICD-10-CM | POA: Diagnosis not present

## 2018-10-20 DIAGNOSIS — R531 Weakness: Secondary | ICD-10-CM | POA: Diagnosis not present

## 2018-10-20 DIAGNOSIS — R6883 Chills (without fever): Secondary | ICD-10-CM | POA: Diagnosis not present

## 2018-10-20 DIAGNOSIS — R112 Nausea with vomiting, unspecified: Secondary | ICD-10-CM | POA: Diagnosis not present

## 2018-10-20 DIAGNOSIS — R05 Cough: Secondary | ICD-10-CM | POA: Diagnosis not present

## 2018-10-20 DIAGNOSIS — Z886 Allergy status to analgesic agent status: Secondary | ICD-10-CM | POA: Diagnosis not present

## 2018-10-20 DIAGNOSIS — K7460 Unspecified cirrhosis of liver: Secondary | ICD-10-CM | POA: Diagnosis not present

## 2018-10-20 DIAGNOSIS — R109 Unspecified abdominal pain: Secondary | ICD-10-CM | POA: Diagnosis not present

## 2018-10-20 DIAGNOSIS — R0602 Shortness of breath: Secondary | ICD-10-CM | POA: Diagnosis not present

## 2018-10-20 DIAGNOSIS — R1110 Vomiting, unspecified: Secondary | ICD-10-CM | POA: Diagnosis not present

## 2018-10-20 DIAGNOSIS — E119 Type 2 diabetes mellitus without complications: Secondary | ICD-10-CM | POA: Diagnosis not present

## 2018-10-20 DIAGNOSIS — I700 Atherosclerosis of aorta: Secondary | ICD-10-CM | POA: Diagnosis not present

## 2018-10-21 DIAGNOSIS — R197 Diarrhea, unspecified: Secondary | ICD-10-CM | POA: Diagnosis not present

## 2018-10-21 DIAGNOSIS — R269 Unspecified abnormalities of gait and mobility: Secondary | ICD-10-CM | POA: Diagnosis not present

## 2018-10-21 DIAGNOSIS — E1140 Type 2 diabetes mellitus with diabetic neuropathy, unspecified: Secondary | ICD-10-CM | POA: Diagnosis not present

## 2018-10-23 DIAGNOSIS — R2689 Other abnormalities of gait and mobility: Secondary | ICD-10-CM | POA: Diagnosis not present

## 2018-10-24 DIAGNOSIS — L539 Erythematous condition, unspecified: Secondary | ICD-10-CM | POA: Diagnosis not present

## 2018-10-24 DIAGNOSIS — R21 Rash and other nonspecific skin eruption: Secondary | ICD-10-CM | POA: Diagnosis not present

## 2018-10-29 DIAGNOSIS — R600 Localized edema: Secondary | ICD-10-CM | POA: Diagnosis not present

## 2018-10-29 DIAGNOSIS — R21 Rash and other nonspecific skin eruption: Secondary | ICD-10-CM | POA: Diagnosis not present

## 2018-11-04 DIAGNOSIS — R42 Dizziness and giddiness: Secondary | ICD-10-CM | POA: Diagnosis not present

## 2018-11-04 DIAGNOSIS — R05 Cough: Secondary | ICD-10-CM | POA: Diagnosis not present

## 2018-11-04 DIAGNOSIS — M792 Neuralgia and neuritis, unspecified: Secondary | ICD-10-CM | POA: Diagnosis not present

## 2018-11-04 DIAGNOSIS — Z0000 Encounter for general adult medical examination without abnormal findings: Secondary | ICD-10-CM | POA: Diagnosis not present

## 2018-11-04 DIAGNOSIS — E782 Mixed hyperlipidemia: Secondary | ICD-10-CM | POA: Diagnosis not present

## 2018-11-04 DIAGNOSIS — Z0001 Encounter for general adult medical examination with abnormal findings: Secondary | ICD-10-CM | POA: Diagnosis not present

## 2018-11-04 DIAGNOSIS — R21 Rash and other nonspecific skin eruption: Secondary | ICD-10-CM | POA: Diagnosis not present

## 2018-11-04 DIAGNOSIS — R600 Localized edema: Secondary | ICD-10-CM | POA: Diagnosis not present

## 2018-11-04 DIAGNOSIS — J069 Acute upper respiratory infection, unspecified: Secondary | ICD-10-CM | POA: Diagnosis not present

## 2018-11-04 DIAGNOSIS — E1122 Type 2 diabetes mellitus with diabetic chronic kidney disease: Secondary | ICD-10-CM | POA: Diagnosis not present

## 2018-11-04 DIAGNOSIS — N183 Chronic kidney disease, stage 3 (moderate): Secondary | ICD-10-CM | POA: Diagnosis not present

## 2018-11-04 DIAGNOSIS — R609 Edema, unspecified: Secondary | ICD-10-CM | POA: Diagnosis not present

## 2018-11-04 DIAGNOSIS — E559 Vitamin D deficiency, unspecified: Secondary | ICD-10-CM | POA: Diagnosis not present

## 2018-11-04 DIAGNOSIS — I129 Hypertensive chronic kidney disease with stage 1 through stage 4 chronic kidney disease, or unspecified chronic kidney disease: Secondary | ICD-10-CM | POA: Diagnosis not present

## 2018-11-04 DIAGNOSIS — M542 Cervicalgia: Secondary | ICD-10-CM | POA: Diagnosis not present

## 2019-01-08 DIAGNOSIS — K7460 Unspecified cirrhosis of liver: Secondary | ICD-10-CM | POA: Diagnosis not present

## 2019-01-08 DIAGNOSIS — E1142 Type 2 diabetes mellitus with diabetic polyneuropathy: Secondary | ICD-10-CM | POA: Diagnosis not present

## 2019-01-08 DIAGNOSIS — N183 Chronic kidney disease, stage 3 (moderate): Secondary | ICD-10-CM | POA: Diagnosis not present

## 2019-01-08 DIAGNOSIS — M961 Postlaminectomy syndrome, not elsewhere classified: Secondary | ICD-10-CM | POA: Diagnosis not present

## 2019-01-13 IMAGING — RF DG CERVICAL SPINE 1V
1 series · 1 of 1 positions shown · non-contrast
Comparison: No recent.

CLINICAL DATA: Cervical spine surgery.

EXAM:
CERVICAL SPINE 1 VIEW

[Series 1: run · 1 of 1 slices shown]
[im 1/1]
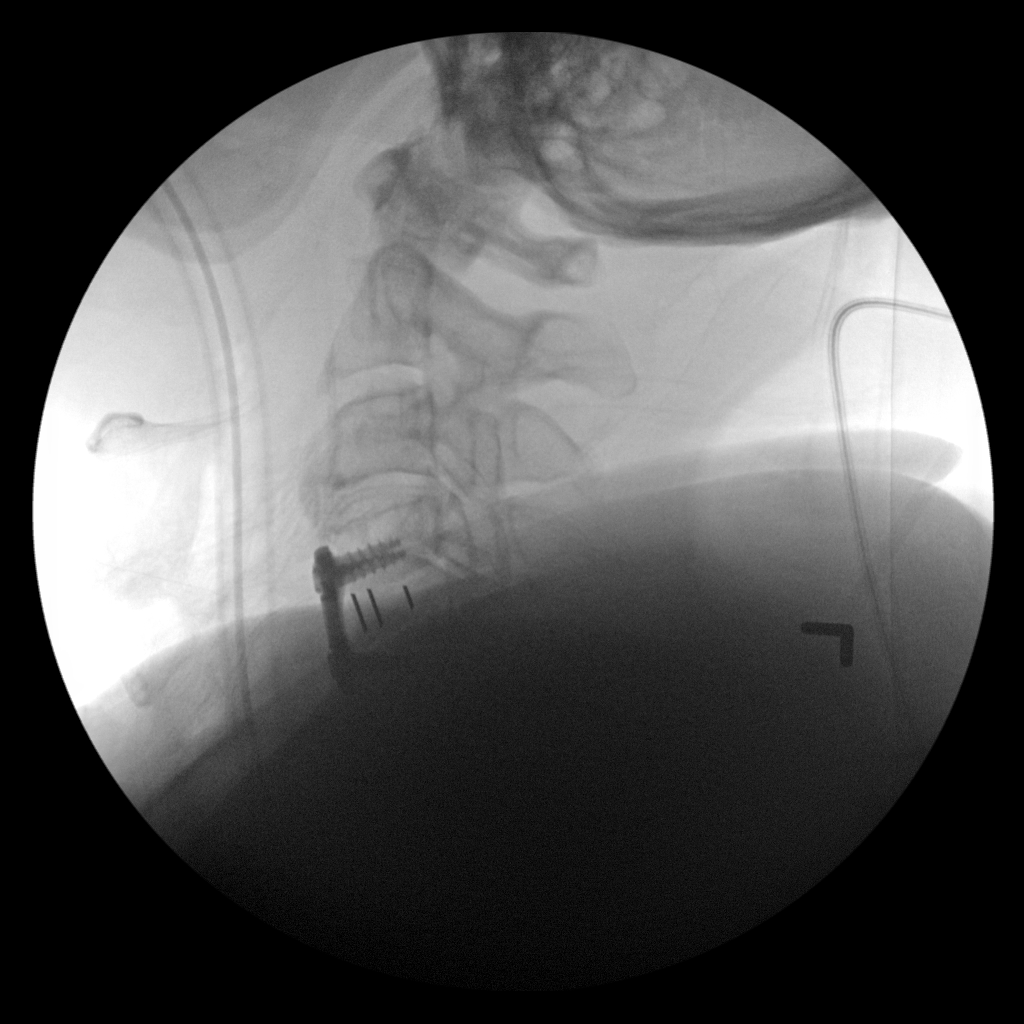

[1 of 1 positions shown; findings below may reference images not displayed]

FINDINGS: C4-C5 anterior and interbody fusion. Anatomic alignment. Hardware
appears to be intact.
IMPRESSION: C4-C5 anterior interbody fusion.

## 2019-01-15 DIAGNOSIS — L718 Other rosacea: Secondary | ICD-10-CM | POA: Diagnosis not present

## 2019-01-15 DIAGNOSIS — L821 Other seborrheic keratosis: Secondary | ICD-10-CM | POA: Diagnosis not present

## 2019-01-15 DIAGNOSIS — L738 Other specified follicular disorders: Secondary | ICD-10-CM | POA: Diagnosis not present

## 2019-03-05 DIAGNOSIS — L918 Other hypertrophic disorders of the skin: Secondary | ICD-10-CM | POA: Diagnosis not present

## 2019-03-05 DIAGNOSIS — L718 Other rosacea: Secondary | ICD-10-CM | POA: Diagnosis not present

## 2019-04-09 DIAGNOSIS — M069 Rheumatoid arthritis, unspecified: Secondary | ICD-10-CM | POA: Diagnosis not present

## 2019-04-09 DIAGNOSIS — G629 Polyneuropathy, unspecified: Secondary | ICD-10-CM | POA: Diagnosis not present

## 2019-04-09 DIAGNOSIS — M461 Sacroiliitis, not elsewhere classified: Secondary | ICD-10-CM | POA: Diagnosis not present

## 2019-04-09 DIAGNOSIS — M4802 Spinal stenosis, cervical region: Secondary | ICD-10-CM | POA: Diagnosis not present

## 2019-04-21 ENCOUNTER — Other Ambulatory Visit: Payer: Self-pay

## 2019-04-22 DIAGNOSIS — M461 Sacroiliitis, not elsewhere classified: Secondary | ICD-10-CM | POA: Diagnosis not present

## 2019-04-30 ENCOUNTER — Ambulatory Visit (INDEPENDENT_AMBULATORY_CARE_PROVIDER_SITE_OTHER): Payer: PPO | Admitting: Cardiovascular Disease

## 2019-04-30 ENCOUNTER — Encounter: Payer: Self-pay | Admitting: Cardiovascular Disease

## 2019-04-30 ENCOUNTER — Other Ambulatory Visit: Payer: Self-pay

## 2019-04-30 VITALS — BP 154/85 | HR 69 | Temp 97.2°F | Ht 66.0 in | Wt 206.0 lb

## 2019-04-30 DIAGNOSIS — I10 Essential (primary) hypertension: Secondary | ICD-10-CM

## 2019-04-30 DIAGNOSIS — E782 Mixed hyperlipidemia: Secondary | ICD-10-CM

## 2019-04-30 DIAGNOSIS — I2510 Atherosclerotic heart disease of native coronary artery without angina pectoris: Secondary | ICD-10-CM | POA: Diagnosis not present

## 2019-04-30 DIAGNOSIS — E1122 Type 2 diabetes mellitus with diabetic chronic kidney disease: Secondary | ICD-10-CM

## 2019-04-30 DIAGNOSIS — Z794 Long term (current) use of insulin: Secondary | ICD-10-CM

## 2019-04-30 DIAGNOSIS — K7460 Unspecified cirrhosis of liver: Secondary | ICD-10-CM

## 2019-04-30 DIAGNOSIS — I428 Other cardiomyopathies: Secondary | ICD-10-CM | POA: Diagnosis not present

## 2019-04-30 DIAGNOSIS — N183 Chronic kidney disease, stage 3 unspecified: Secondary | ICD-10-CM | POA: Diagnosis not present

## 2019-04-30 MED ORDER — ROSUVASTATIN CALCIUM 10 MG PO TABS: 10.0000 mg | ORAL_TABLET | Freq: Every day | ORAL | 3 refills | Status: DC

## 2019-04-30 NOTE — Progress Notes (Signed)
Cardiology Office Note:    Date:  05/03/2019   ID:  [name] [name], DOB 25-Sep-1950, MRN [id number]  PCP:  Celene Squibb, MD  Cardiologist:  No primary care provider on file.  Electrophysiologist:  None   Referring MD: Celene Squibb, MD   Chief Complaint  Patient presents with  . Coronary Artery Disease    History of Present Illness:    [name] [name] is a 68 y.o. male with a hx of diffuse CAD, chronic liver disease, rheumatoid arthritis, diabetes mellitus type 2 (on insulin and Jardiance), hypertension, hyperlipidemia.  He has evidence of of nonischemic cardiomyopathy (minimally depressed LVEF and diastolic dysfunction), without overt heart failure.    In 2011 cardiac catheterization showed only moderate CAD (Reportedly there was no ostium for the RCA, which filled retrogradely as a branch of the LCX, 50-60% left circumflex, 30% LAD stenosis), but had mildly depressed LVEF 45-50% by echo/53% by nuclear stress test.  A follow-up nuclear stress test in 2015 showed a normal pattern of perfusion with an EF of 43%, although the echo still showed EF of 50-55%.  There was echo evidence of diastolic dysfunction but no clear evidence of elevated filling pressures.    The patient specifically denies any chest pain at rest exertion, dyspnea at rest or with exertion, orthopnea, paroxysmal nocturnal dyspnea, syncope, palpitations, focal neurological deficits, intermittent claudication, lower extremity edema, unexplained weight gain, cough, hemoptysis or wheezing.  Diabetes control is borderline with a hemoglobin A1c of 7.7% earlier this year.  His triglycerides were markedly elevated at 441 so the LDL cholesterol could not be calculated.  The HDL was very low at 30.  He bears a possible diagnosis of cirrhosis based on nodular appearance of liver on CT of the abdomen and ultrasonography, but all his liver function tests and coagulation studies were normal when we last checked.  He has no cardiovascular  complaints.  He is feeling much better after undergoing neck surgery in January 2020 and receiving a shot in his right hip more recently.  Earlier this year he was diagnosed with rheumatoid arthritis when he presented with swollen joints in his right upper extremity, especially the right wrist.  He was initially tried on an injectable biologic, but has now been switched to [employer].  He sees Marella Chimes, [place].  Past Medical History:  Diagnosis Date  . Anxiety   . Arthritis   . CAD (coronary artery disease) cath in 2011  . CAD in native artery 05/18/2014  . Cellulitis of toe, right july 2012  . Chronic diastolic heart failure ([place]) 05/18/2014  . Cirrhosis of liver ([place])    ? ETOH, fatty liver   . Depression   . Diabetes mellitus   . Hyperlipidemia 05/18/2014  . Hypertension   . Nonischemic cardiomyopathy ([place]) 05/18/2014    Past Surgical History:  Procedure Laterality Date  . ANTERIOR CERVICAL DECOMP/DISCECTOMY FUSION N/A 06/14/2017   Procedure: ANTERIOR CERVICAL DECOMPRESSION/DISCECTOMY FUSION CERVICAL 4- CERVICAL 5;  Surgeon: Earnie Larsson, MD;  Location: [facility];  Service: Neurosurgery;  Laterality: N/A;  ANTERIOR CERVICAL DECOMPRESSION/DISCECTOMY FUSION CERVICAL 4- CERVICAL 5  . CARDIAC CATHETERIZATION  05/24/2010  . Carotid duplex  05/23/2010  . CARPAL TUNNEL RELEASE    . CERVICAL SPINE SURGERY    . COLONOSCOPY  FEB 2010 ARS NUR [place]   NL TCS  . COLONOSCOPY N/A 08/01/2015   Dr. Oneida Alar: small internal hemorrhoids, next TCS in 10 years   . DOPPLER ECHOCARDIOGRAPHY  05/09/2010  EF 45-50%  . ESOPHAGOGASTRODUODENOSCOPY  02/07/2011   Dr. Oneida Alar: H.pylori gastritis, mild portal gastropathy, treatmetn with Amoxicillin/Biaxin   . ESOPHAGOGASTRODUODENOSCOPY N/A 08/01/2015   Dr. Oneida Alar: Reflux esophagitis, mild gastritis, next EGD in 3 years   . lower ext atrerial duplex  05/23/2010  . NM MYOVIEW LTD  01/08/2008   EF 53%  Low risk scan  . sleep study  01/01/2008    Current Medications: Current  Meds  Medication Sig  . aspirin 81 MG tablet Take 81 mg by mouth daily.    . carvedilol (COREG) 25 MG tablet Take 12.5 mg by mouth 2 (two) times daily with a meal.  . cyclobenzaprine (FLEXERIL) 10 MG tablet Take 1 tablet (10 mg total) by mouth 3 (three) times daily as needed for muscle spasms.  Marland Kitchen desvenlafaxine (PRISTIQ) 100 MG 24 hr tablet Take 100 mg by mouth daily.  . empagliflozin (JARDIANCE) 10 MG TABS tablet Take 10 mg by mouth daily.  . fish oil-omega-3 fatty acids 1000 MG capsule Take 1 g by mouth daily.   . fluocinonide cream (LIDEX) [id number] % Apply 1 application topically daily.   Marland Kitchen gabapentin (NEURONTIN) 600 MG tablet Take 600 mg by mouth 2 (two) times daily.   Marland Kitchen HYDROcodone-acetaminophen (NORCO) 10-325 MG tablet Take 1-2 tablets by mouth every 4 (four) hours as needed for severe pain ((score 7 to 10)).  Marland Kitchen insulin aspart (NOVOLOG) 100 UNIT/ML injection Inject 10 Units into the skin 3 (three) times daily before meals.  . insulin glargine (LANTUS) 100 UNIT/ML injection Inject 50 Units into the skin 2 (two) times daily.   . naproxen sodium (ALEVE) 220 MG tablet Take 220 mg by mouth daily as needed (for pain or headache).  . pantoprazole (PROTONIX) 40 MG tablet Take 1 tablet (40 mg total) by mouth 2 (two) times daily before a meal.  . sildenafil (VIAGRA) 100 MG tablet Take 100 mg by mouth daily as needed for erectile dysfunction.  . traZODone (DESYREL) 100 MG tablet Take 200 mg by mouth at bedtime.      Allergies:   Naprosyn [naproxen]   Social History   Socioeconomic History  . Marital status: Single    Spouse name: Not on file  . Number of children: 5  . Years of education: 9  . Highest education level: GED or equivalent  Occupational History  . Occupation: Chemical engineer    Comment: Exposure to gold, tin, nickle  Social Needs  . Financial resource strain: Not on file  . Food insecurity    Worry: Not on file    Inability: Not on file  . Transportation needs    Medical:  Not on file    Non-medical: Not on file  Tobacco Use  . Smoking status: Never Smoker  . Smokeless tobacco: Never Used  Substance and Sexual Activity  . Alcohol use: Yes    Alcohol/week: 3.0 standard drinks    Types: 3 Cans of beer per week    Comment: very rare alcohol use-a beer maybe every 6 months   . Drug use: No    Types: Marijuana    Comment: as a young teen  . Sexual activity: Not on file  Lifestyle  . Physical activity    Days per week: Not on file    Minutes per session: Not on file  . Stress: Not on file  Relationships  . Social Herbalist on phone: Not on file    Gets together: Not on file  Attends religious service: Not on file    Active member of club or organization: Not on file    Attends meetings of clubs or organizations: Not on file    Relationship status: Not on file  Other Topics Concern  . Not on file  Social History Narrative   Lives with daughter in a one story home.  Has 5 children.  On disability.  Education: GED.      Family History: The patient's family history includes Cirrhosis in his mother; Heart attack in his father. There is no history of Colon cancer or Liver disease.  ROS:   Please see the history of present illness.    All other systems reviewed and are negative.  EKGs/Labs/Other Studies Reviewed:    The following studies were reviewed today:   ECHO 05/25/2014:  Study Conclusions:   - Left ventricle: The cavity size was normal. Wall thickness was  normal. Systolic function was normal. The estimated ejection  fraction was in the range of 50% to 55%. Doppler parameters are  consistent with abnormal left ventricular relaxation (grade 1  diastolic dysfunction).  - Mitral valve: There was mild regurgitation.      EKG:  EKG is ordered today.  The ekg ordered today demonstrates sinus rhythm, T wave inversion in leads III and aVF only, QTC 442 ms.  Recent Labs: No results found for requested labs within last  8760 hours.  11/04/2018  creatinine 1.89, hemoglobin 14.3, hemoglobin A1c 7.7%, ALT 24, TSH 4.18 Note the creatinine in 2017-2019 was 1.20-1.27, corresponding to a GFR of approximately 60 Recent Lipid Panel    Component Value Date/Time   CHOL 153 11/26/2010 0523   TRIG 252 (H) 11/26/2010 0523   HDL 25 (L) 11/26/2010 0523   CHOLHDL 6.1 11/26/2010 0523   VLDL 50 (H) 11/26/2010 0523   LDLCALC 78 11/26/2010 0523  11/04/2018 Total cholesterol 176, HDL 30, triglycerides 441  Physical Exam:    VS:  BP (!) 154/85   Pulse 69   Temp (!) 97.2 F (36.2 C)   Ht 5\' 6"  (1.676 m)   Wt 206 lb (93.4 kg)   SpO2 95%   BMI 33.25 kg/m     Wt Readings from Last 3 Encounters:  04/30/19 206 lb (93.4 kg)  11/05/17 193 lb 4 oz (87.7 kg)  06/14/17 195 lb (88.5 kg)     GEN:  Well nourished, well developed in no acute distress HEENT: Normal NECK: No JVD; No carotid bruits LYMPHATICS: No lymphadenopathy CARDIAC: RRR, no murmurs, rubs, gallops RESPIRATORY:  Clear to auscultation without rales, wheezing or rhonchi  ABDOMEN: Soft, non-tender, non-distended MUSCULOSKELETAL:  No edema; No deformity  SKIN: Warm and dry NEUROLOGIC:  Alert and oriented x 3 PSYCHIATRIC:  Normal affect   ASSESSMENT:    1. CAD in native artery   2. Nonischemic cardiomyopathy ([place])   3. Type 2 diabetes mellitus with stage 3 chronic kidney disease, with long-term current use of insulin, unspecified whether stage 3a or 3b CKD ([place])   4. Mixed hyperlipidemia   5. Essential hypertension   6. Stage 3 chronic kidney disease, unspecified whether stage 3a or 3b CKD   7. Cirrhosis of liver without ascites, unspecified hepatic cirrhosis type ([place])    PLAN:    In order of problems listed above:  1. CAD: Asymptomatic.  Last functional study was in 2015 and showed low risk findings.  LDL cholesterol cannot be calculated due to marked hypertriglyceridemia, but is probably well above  the  desirable range (<70).  The focus should  be on better glycemic control which will lead to improved triglycerides as well.  He does not smoke 2. CMP: Borderline reduction in LVEF by echo, 50-55%.  No signs or symptoms of congestive heart failure.  On high-dose carvedilol.  Does not require loop diuretics (but is receiving Jardiance).  Reluctant to discuss adding RAAS inhibitors with most recent creatinine being markedly elevated compared to baseline.  Needs updated renal function tests. 3. DM: Needs improved control, target A1c less than 7%.  Vania Rea is an excellent choice considering his comorbidities. 4. HLP: Repeat lipid profile after 3 months on rosuvastatin 10 mg once daily.   Ideally would achieve improved lipid profile through lifestyle changes and better glycemic control. 5. HTN: Target BP 130/80 or less.  His blood pressure was elevated when he first checked in, but when I rechecked it it was only 127/80.  Consider adding ARB if renal function is back to baseline. 6. CKD 3: Creatinine in June was markedly elevated at 1.89, but the baseline seems to be about 1.2-1.5, which would not prevent the use of an ARB.  Recheck labs. 7. Possible cirrhosis: Caution with prescription of statin since he may have underlying chronic liver disease, but he reports drastically reducing his intake of alcohol.   Medication Adjustments/Labs and Tests Ordered: Current medicines are reviewed at length with the patient today.  Concerns regarding medicines are outlined above.  Orders Placed This Encounter  Procedures  . Lipid panel  . EKG 12-Lead   Meds ordered this encounter  Medications  . rosuvastatin (CRESTOR) 10 MG tablet    Sig: Take 1 tablet (10 mg total) by mouth daily.    Dispense:  90 tablet    Refill:  3    Patient Instructions  Medication Instructions:  START Rosuvastatin 10 mg once daily  *If you need a refill on your cardiac medications before your next appointment, please call your pharmacy*  Lab Work: Your provider would like  for you to return in 3 months to have the following labs drawn: Fasting Lipid. You do not need an appointment for the lab. Once in our office lobby there is a podium where you can sign in and ring the doorbell to alert [place] that you are here. The lab is open from 8:00 am to 4:30 pm; closed for lunch from 12:45pm-1:45pm.  If you have labs (blood work) drawn today and your tests are completely normal, you will receive your results only by: Marland Kitchen MyChart Message (if you have MyChart) OR . A paper copy in the mail If you have any lab test that is abnormal or we need to change your treatment, we will call you to review the results.  Testing/Procedures: None ordered  Follow-Up: At [facility], you and your health needs are our priority.  As part of our continuing mission to provide you with exceptional heart care, we have created designated Provider Care Teams.  These Care Teams include your primary Cardiologist (physician) and Advanced Practice Providers (APPs -  Physician Assistants and Nurse Practitioners) who all work together to provide you with the care you need, when you need it.  Your next appointment:   12 month(s)  The format for your next appointment:   In Person  Provider:   Sanda Klein, MD      Signed, Sanda Klein, MD  05/03/2019 10:44 AM    [place]

## 2019-04-30 NOTE — Patient Instructions (Signed)
Medication Instructions:  START Rosuvastatin 10 mg once daily  *If you need a refill on your cardiac medications before your next appointment, please call your pharmacy*  Lab Work: Your provider would like for you to return in 3 months to have the following labs drawn: Fasting Lipid. You do not need an appointment for the lab. Once in our office lobby there is a podium where you can sign in and ring the doorbell to alert [place] that you are here. The lab is open from 8:00 am to 4:30 pm; closed for lunch from 12:45pm-1:45pm.  If you have labs (blood work) drawn today and your tests are completely normal, you will receive your results only by: Marland Kitchen MyChart Message (if you have MyChart) OR . A paper copy in the mail If you have any lab test that is abnormal or we need to change your treatment, we will call you to review the results.  Testing/Procedures: None ordered  Follow-Up: At [facility], you and your health needs are our priority.  As part of our continuing mission to provide you with exceptional heart care, we have created designated Provider Care Teams.  These Care Teams include your primary Cardiologist (physician) and Advanced Practice Providers (APPs -  Physician Assistants and Nurse Practitioners) who all work together to provide you with the care you need, when you need it.  Your next appointment:   12 month(s)  The format for your next appointment:   In Person  Provider:   Sanda Klein, MD

## 2019-05-03 ENCOUNTER — Encounter: Payer: Self-pay | Admitting: Cardiovascular Disease

## 2019-05-04 ENCOUNTER — Other Ambulatory Visit: Payer: Self-pay | Admitting: *Deleted

## 2019-05-04 DIAGNOSIS — I10 Essential (primary) hypertension: Secondary | ICD-10-CM

## 2019-05-04 DIAGNOSIS — I428 Other cardiomyopathies: Secondary | ICD-10-CM

## 2019-06-16 DIAGNOSIS — G9009 Other idiopathic peripheral autonomic neuropathy: Secondary | ICD-10-CM | POA: Diagnosis not present

## 2019-06-16 DIAGNOSIS — E559 Vitamin D deficiency, unspecified: Secondary | ICD-10-CM | POA: Diagnosis not present

## 2019-06-16 DIAGNOSIS — E1122 Type 2 diabetes mellitus with diabetic chronic kidney disease: Secondary | ICD-10-CM | POA: Diagnosis not present

## 2019-06-16 DIAGNOSIS — I129 Hypertensive chronic kidney disease with stage 1 through stage 4 chronic kidney disease, or unspecified chronic kidney disease: Secondary | ICD-10-CM | POA: Diagnosis not present

## 2019-06-16 DIAGNOSIS — M792 Neuralgia and neuritis, unspecified: Secondary | ICD-10-CM | POA: Diagnosis not present

## 2019-06-16 DIAGNOSIS — N183 Chronic kidney disease, stage 3 unspecified: Secondary | ICD-10-CM | POA: Diagnosis not present

## 2019-06-16 DIAGNOSIS — E7849 Other hyperlipidemia: Secondary | ICD-10-CM | POA: Diagnosis not present

## 2019-06-17 DIAGNOSIS — M150 Primary generalized (osteo)arthritis: Secondary | ICD-10-CM | POA: Diagnosis not present

## 2019-06-17 DIAGNOSIS — E669 Obesity, unspecified: Secondary | ICD-10-CM | POA: Diagnosis not present

## 2019-06-17 DIAGNOSIS — E119 Type 2 diabetes mellitus without complications: Secondary | ICD-10-CM | POA: Diagnosis not present

## 2019-06-17 DIAGNOSIS — Z6833 Body mass index (BMI) 33.0-33.9, adult: Secondary | ICD-10-CM | POA: Diagnosis not present

## 2019-06-17 DIAGNOSIS — M5410 Radiculopathy, site unspecified: Secondary | ICD-10-CM | POA: Diagnosis not present

## 2019-06-17 DIAGNOSIS — R5383 Other fatigue: Secondary | ICD-10-CM | POA: Diagnosis not present

## 2019-06-17 DIAGNOSIS — M5030 Other cervical disc degeneration, unspecified cervical region: Secondary | ICD-10-CM | POA: Diagnosis not present

## 2019-06-17 DIAGNOSIS — Z794 Long term (current) use of insulin: Secondary | ICD-10-CM | POA: Diagnosis not present

## 2019-06-17 DIAGNOSIS — R21 Rash and other nonspecific skin eruption: Secondary | ICD-10-CM | POA: Diagnosis not present

## 2019-06-17 DIAGNOSIS — M25431 Effusion, right wrist: Secondary | ICD-10-CM | POA: Diagnosis not present

## 2019-06-17 DIAGNOSIS — L4050 Arthropathic psoriasis, unspecified: Secondary | ICD-10-CM | POA: Diagnosis not present

## 2019-07-08 DIAGNOSIS — N3941 Urge incontinence: Secondary | ICD-10-CM | POA: Diagnosis not present

## 2019-07-08 DIAGNOSIS — E1140 Type 2 diabetes mellitus with diabetic neuropathy, unspecified: Secondary | ICD-10-CM | POA: Diagnosis not present

## 2019-07-22 DIAGNOSIS — Z0000 Encounter for general adult medical examination without abnormal findings: Secondary | ICD-10-CM | POA: Diagnosis not present

## 2019-07-22 DIAGNOSIS — R21 Rash and other nonspecific skin eruption: Secondary | ICD-10-CM | POA: Diagnosis not present

## 2019-07-22 DIAGNOSIS — E782 Mixed hyperlipidemia: Secondary | ICD-10-CM | POA: Diagnosis not present

## 2019-07-22 DIAGNOSIS — R05 Cough: Secondary | ICD-10-CM | POA: Diagnosis not present

## 2019-07-22 DIAGNOSIS — R609 Edema, unspecified: Secondary | ICD-10-CM | POA: Diagnosis not present

## 2019-07-22 DIAGNOSIS — R42 Dizziness and giddiness: Secondary | ICD-10-CM | POA: Diagnosis not present

## 2019-07-22 DIAGNOSIS — N183 Chronic kidney disease, stage 3 unspecified: Secondary | ICD-10-CM | POA: Diagnosis not present

## 2019-07-22 DIAGNOSIS — Z0001 Encounter for general adult medical examination with abnormal findings: Secondary | ICD-10-CM | POA: Diagnosis not present

## 2019-07-22 DIAGNOSIS — I129 Hypertensive chronic kidney disease with stage 1 through stage 4 chronic kidney disease, or unspecified chronic kidney disease: Secondary | ICD-10-CM | POA: Diagnosis not present

## 2019-07-22 DIAGNOSIS — E1122 Type 2 diabetes mellitus with diabetic chronic kidney disease: Secondary | ICD-10-CM | POA: Diagnosis not present

## 2019-07-22 DIAGNOSIS — J069 Acute upper respiratory infection, unspecified: Secondary | ICD-10-CM | POA: Diagnosis not present

## 2019-07-22 DIAGNOSIS — R600 Localized edema: Secondary | ICD-10-CM | POA: Diagnosis not present

## 2019-08-04 DIAGNOSIS — M06831 Other specified rheumatoid arthritis, right wrist: Secondary | ICD-10-CM | POA: Diagnosis not present

## 2019-08-04 DIAGNOSIS — I10 Essential (primary) hypertension: Secondary | ICD-10-CM | POA: Diagnosis not present

## 2019-08-04 DIAGNOSIS — E781 Pure hyperglyceridemia: Secondary | ICD-10-CM | POA: Diagnosis not present

## 2019-08-04 DIAGNOSIS — N1831 Chronic kidney disease, stage 3a: Secondary | ICD-10-CM | POA: Diagnosis not present

## 2019-08-04 DIAGNOSIS — E1165 Type 2 diabetes mellitus with hyperglycemia: Secondary | ICD-10-CM | POA: Diagnosis not present

## 2019-08-04 DIAGNOSIS — G4700 Insomnia, unspecified: Secondary | ICD-10-CM | POA: Diagnosis not present

## 2019-08-04 DIAGNOSIS — F330 Major depressive disorder, recurrent, mild: Secondary | ICD-10-CM | POA: Diagnosis not present

## 2019-08-11 DIAGNOSIS — N1831 Chronic kidney disease, stage 3a: Secondary | ICD-10-CM | POA: Diagnosis not present

## 2019-08-11 DIAGNOSIS — E1122 Type 2 diabetes mellitus with diabetic chronic kidney disease: Secondary | ICD-10-CM | POA: Diagnosis not present

## 2019-08-11 DIAGNOSIS — M792 Neuralgia and neuritis, unspecified: Secondary | ICD-10-CM | POA: Diagnosis not present

## 2019-08-11 DIAGNOSIS — N183 Chronic kidney disease, stage 3 unspecified: Secondary | ICD-10-CM | POA: Diagnosis not present

## 2019-08-11 DIAGNOSIS — E559 Vitamin D deficiency, unspecified: Secondary | ICD-10-CM | POA: Diagnosis not present

## 2019-08-11 DIAGNOSIS — E782 Mixed hyperlipidemia: Secondary | ICD-10-CM | POA: Diagnosis not present

## 2019-08-11 DIAGNOSIS — I129 Hypertensive chronic kidney disease with stage 1 through stage 4 chronic kidney disease, or unspecified chronic kidney disease: Secondary | ICD-10-CM | POA: Diagnosis not present

## 2019-08-11 DIAGNOSIS — G9009 Other idiopathic peripheral autonomic neuropathy: Secondary | ICD-10-CM | POA: Diagnosis not present

## 2019-09-16 DIAGNOSIS — S39012A Strain of muscle, fascia and tendon of lower back, initial encounter: Secondary | ICD-10-CM | POA: Diagnosis not present

## 2019-09-16 DIAGNOSIS — M545 Low back pain: Secondary | ICD-10-CM | POA: Diagnosis not present

## 2019-09-17 DIAGNOSIS — M792 Neuralgia and neuritis, unspecified: Secondary | ICD-10-CM | POA: Diagnosis not present

## 2019-09-17 DIAGNOSIS — E559 Vitamin D deficiency, unspecified: Secondary | ICD-10-CM | POA: Diagnosis not present

## 2019-09-17 DIAGNOSIS — N183 Chronic kidney disease, stage 3 unspecified: Secondary | ICD-10-CM | POA: Diagnosis not present

## 2019-09-17 DIAGNOSIS — E1122 Type 2 diabetes mellitus with diabetic chronic kidney disease: Secondary | ICD-10-CM | POA: Diagnosis not present

## 2019-09-17 DIAGNOSIS — I129 Hypertensive chronic kidney disease with stage 1 through stage 4 chronic kidney disease, or unspecified chronic kidney disease: Secondary | ICD-10-CM | POA: Diagnosis not present

## 2019-09-17 DIAGNOSIS — G9009 Other idiopathic peripheral autonomic neuropathy: Secondary | ICD-10-CM | POA: Diagnosis not present

## 2019-09-17 DIAGNOSIS — E782 Mixed hyperlipidemia: Secondary | ICD-10-CM | POA: Diagnosis not present

## 2019-10-20 DIAGNOSIS — M461 Sacroiliitis, not elsewhere classified: Secondary | ICD-10-CM | POA: Diagnosis not present

## 2019-10-29 DIAGNOSIS — X32XXXA Exposure to sunlight, initial encounter: Secondary | ICD-10-CM | POA: Diagnosis not present

## 2019-10-29 DIAGNOSIS — L562 Photocontact dermatitis [berloque dermatitis]: Secondary | ICD-10-CM | POA: Diagnosis not present

## 2019-10-29 DIAGNOSIS — L820 Inflamed seborrheic keratosis: Secondary | ICD-10-CM | POA: Diagnosis not present

## 2019-10-29 DIAGNOSIS — D225 Melanocytic nevi of trunk: Secondary | ICD-10-CM | POA: Diagnosis not present

## 2019-10-29 DIAGNOSIS — L570 Actinic keratosis: Secondary | ICD-10-CM | POA: Diagnosis not present

## 2019-11-02 DIAGNOSIS — R21 Rash and other nonspecific skin eruption: Secondary | ICD-10-CM | POA: Diagnosis not present

## 2019-11-04 ENCOUNTER — Telehealth (INDEPENDENT_AMBULATORY_CARE_PROVIDER_SITE_OTHER): Payer: PPO | Admitting: Cardiology

## 2019-11-04 ENCOUNTER — Encounter: Payer: Self-pay | Admitting: Cardiology

## 2019-11-04 VITALS — Ht 66.0 in | Wt 211.0 lb

## 2019-11-04 DIAGNOSIS — I428 Other cardiomyopathies: Secondary | ICD-10-CM | POA: Diagnosis not present

## 2019-11-04 DIAGNOSIS — I2510 Atherosclerotic heart disease of native coronary artery without angina pectoris: Secondary | ICD-10-CM

## 2019-11-04 NOTE — Progress Notes (Signed)
ty

## 2019-11-04 NOTE — Progress Notes (Signed)
Virtual Visit via Telephone Note   This visit type was conducted due to national recommendations for restrictions regarding the COVID-19 Pandemic (e.g. social distancing) in an effort to limit this patient's exposure and mitigate transmission in our community.  Due to his co-morbid illnesses, this patient is at least at moderate risk for complications without adequate follow up.  This format is felt to be most appropriate for this patient at this time.  The patient did not have access to video technology/had technical difficulties with video requiring transitioning to audio format only (telephone).  All issues noted in this document were discussed and addressed.  No physical exam could be performed with this format.  Please refer to the patient's chart for his  consent to telehealth for [facility].   The patient was identified using 2 identifiers.  Date:  11/04/2019   ID:  [name] [name], DOB [birth year], MRN [id number]  Patient Location: Home Provider Location: Home  PCP:  Celene Squibb, MD  Cardiologist:  Dr Sallyanne Kuster Electrophysiologist:  None   Evaluation Performed:  Follow-Up Visit  Chief Complaint:  none  History of Present Illness:    [name] [name] is a 69 y.o. male with a history of moderate coronary disease in 2011.  He has insulin-dependent diabetes and is followed by Dr. Nevada Crane.  He has had a nonischemic cardiomyopathy in the past, prior Myoview showed an ejection fraction of 43% but echo showed an ejection fraction of 50 to 55%.  He has a history of rheumatoid arthritis, cirrhosis, and chronic renal insufficiency.  The last labs we have on file in epic are from 2019.  Patient was contacted today for routine follow-up.  He is doing well from a cardiac standpoint.  He denies any exertional chest pain.  He tells me that he is scheduled to have lab work drawn at Dr. Juel Burrow office on the 19th, will see if we can get those results.  He did have a recent reaction to glipizide and was  changed to glyburide.  The patient does not have symptoms concerning for COVID-19 infection (fever, chills, cough, or new shortness of breath).   He has declined COVID vaccine-  Past Medical History:  Diagnosis Date  . Anxiety   . Arthritis   . CAD (coronary artery disease) cath in 2011  . CAD in native artery 05/18/2014  . Cellulitis of toe, right july 2012  . Chronic diastolic heart failure ([place]) 05/18/2014  . Cirrhosis of liver ([place])    ? ETOH, fatty liver   . Depression   . Diabetes mellitus   . Hyperlipidemia 05/18/2014  . Hypertension   . Nonischemic cardiomyopathy ([place]) 05/18/2014   Past Surgical History:  Procedure Laterality Date  . ANTERIOR CERVICAL DECOMP/DISCECTOMY FUSION N/A 06/14/2017   Procedure: ANTERIOR CERVICAL DECOMPRESSION/DISCECTOMY FUSION CERVICAL 4- CERVICAL 5;  Surgeon: Earnie Larsson, MD;  Location: [facility];  Service: Neurosurgery;  Laterality: N/A;  ANTERIOR CERVICAL DECOMPRESSION/DISCECTOMY FUSION CERVICAL 4- CERVICAL 5  . CARDIAC CATHETERIZATION  05/24/2010  . Carotid duplex  05/23/2010  . CARPAL TUNNEL RELEASE    . CERVICAL SPINE SURGERY    . COLONOSCOPY  FEB 2010 ARS NUR [place]   NL TCS  . COLONOSCOPY N/A 08/01/2015   Dr. Oneida Alar: small internal hemorrhoids, next TCS in 10 years   . DOPPLER ECHOCARDIOGRAPHY  05/09/2010   EF 45-50%  . ESOPHAGOGASTRODUODENOSCOPY  02/07/2011   Dr. Oneida Alar: H.pylori gastritis, mild portal gastropathy, treatmetn with Amoxicillin/Biaxin   . ESOPHAGOGASTRODUODENOSCOPY N/A 08/01/2015  Dr. Oneida Alar: Reflux esophagitis, mild gastritis, next EGD in 3 years   . lower ext atrerial duplex  05/23/2010  . NM MYOVIEW LTD  01/08/2008   EF 53%  Low risk scan  . sleep study  01/01/2008     Current Meds  Medication Sig  . aspirin 81 MG tablet Take 81 mg by mouth daily.    . carvedilol (COREG) 25 MG tablet Take 12.5 mg by mouth 2 (two) times daily with a meal.  . cyclobenzaprine (FLEXERIL) 10 MG tablet Take 1 tablet (10 mg total) by mouth 3  (three) times daily as needed for muscle spasms.  Marland Kitchen desvenlafaxine (PRISTIQ) 100 MG 24 hr tablet Take 100 mg by mouth daily.  . empagliflozin (JARDIANCE) 10 MG TABS tablet Take 10 mg by mouth daily.  . fish oil-omega-3 fatty acids 1000 MG capsule Take 1 g by mouth daily.   . fluocinonide cream (LIDEX) 7.06 % Apply 1 application topically daily.   Marland Kitchen gabapentin (NEURONTIN) 600 MG tablet Take 600 mg by mouth 2 (two) times daily.   Marland Kitchen glyBURIDE (DIABETA) 5 MG tablet Take 5 mg by mouth at bedtime.  Marland Kitchen HYDROcodone-acetaminophen (NORCO) 10-325 MG tablet Take 1-2 tablets by mouth every 4 (four) hours as needed for severe pain ((score 7 to 10)).  Marland Kitchen insulin aspart (NOVOLOG) 100 UNIT/ML injection Inject 10 Units into the skin 3 (three) times daily before meals.  . insulin glargine (LANTUS) 100 UNIT/ML injection Inject 50 Units into the skin 2 (two) times daily.   . naproxen sodium (ALEVE) 220 MG tablet Take 220 mg by mouth daily as needed (for pain or headache).  . pantoprazole (PROTONIX) 40 MG tablet Take 1 tablet (40 mg total) by mouth 2 (two) times daily before a meal.  . sildenafil (VIAGRA) 100 MG tablet Take 100 mg by mouth daily as needed for erectile dysfunction.  . traZODone (DESYREL) 100 MG tablet Take 200 mg by mouth at bedtime.      Allergies:   Glipizide and Naprosyn [naproxen]   Social History   Tobacco Use  . Smoking status: Never Smoker  . Smokeless tobacco: Never Used  Substance Use Topics  . Alcohol use: Yes    Alcohol/week: 3.0 standard drinks    Types: 3 Cans of beer per week    Comment: very rare alcohol use-a beer maybe every 6 months   . Drug use: No    Types: Marijuana    Comment: as a young teen     Family Hx: The patient's family history includes Cirrhosis in his mother; Heart attack in his father. There is no history of Colon cancer or Liver disease.  ROS:   Please see the history of present illness.    All other systems reviewed and are negative.   Prior CV  studies:   The following studies were reviewed today:  Echo Dec 2015- Study Conclusions   - Left ventricle: The cavity size was normal. Wall thickness was  normal. Systolic function was normal. The estimated ejection  fraction was in the range of 50% to 55%. Doppler parameters are  consistent with abnormal left ventricular relaxation (grade 1  diastolic dysfunction).  - Mitral valve: There was mild regurgitation.  Myoview Dec 2015- Overall Impression:  Low risk stress nuclear study Apical thinning.  LV Wall Motion:  Mild to moderate global LV dysfunction   Labs/Other Tests and Data Reviewed:    EKG:  An ECG dated 04/30/2019 was personally reviewed today and demonstrated:  NSR-  NSST changes, HR 70  Recent Labs: No results found for requested labs within last 8760 hours.   Recent Lipid Panel Lab Results  Component Value Date/Time   CHOL 153 11/26/2010 05:23 AM   TRIG 252 (H) 11/26/2010 05:23 AM   HDL 25 (L) 11/26/2010 05:23 AM   CHOLHDL 6.1 11/26/2010 05:23 AM   LDLCALC 78 11/26/2010 05:23 AM    Wt Readings from Last 3 Encounters:  11/04/19 211 lb (95.7 kg)  04/30/19 206 lb (93.4 kg)  11/05/17 193 lb 4 oz (87.7 kg)     Objective:    Vital Signs:  Ht 5\' 6"  (1.676 m)   Wt 211 lb (95.7 kg) Comment: taken earlier this week  BMI 34.06 kg/m    VITAL SIGNS:  reviewed  ASSESSMENT & PLAN:    CAD- Moderate CAD by cath in 2009 and 2011- no symptoms c/w angina.  IDDM- Followed by PCP  NICM- EF depressed by Myoview, normal by echo Dec 2015  Plan: he is followed closely by his PCP- f/u dr Sallyanne Kuster in 6 months in the office.   COVID-19 Education: The signs and symptoms of COVID-19 were discussed with the patient and how to seek care for testing (follow up with PCP or arrange E-visit).  The importance of social distancing was discussed today. (Pt declines vaccine- see above)  Time:   Today, I have spent 15 minutes with the patient with telehealth technology  discussing the above problems.     Medication Adjustments/Labs and Tests Ordered: Current medicines are reviewed at length with the patient today.  Concerns regarding medicines are outlined above.   Tests Ordered: No orders of the defined types were placed in this encounter.   Medication Changes: No orders of the defined types were placed in this encounter.   Follow Up:  In Person Dr Sallyanne Kuster or an APP in 6 months  Signed, Kerin Ransom, Hershal Coria  11/04/2019 9:10 AM    [place]

## 2019-11-04 NOTE — Patient Instructions (Signed)
Medication Instructions:  Your physician recommends that you continue on your current medications as directed. Please refer to the Current Medication list given to you today.  *If you need a refill on your cardiac medications before your next appointment, please call your pharmacy*  Follow-Up: At [facility], you and your health needs are our priority.  As part of our continuing mission to provide you with exceptional heart care, we have created designated Provider Care Teams.  These Care Teams include your primary Cardiologist (physician) and Advanced Practice Providers (APPs -  Physician Assistants and Nurse Practitioners) who all work together to provide you with the care you need, when you need it.  We recommend signing up for the patient portal called "MyChart".  Sign up information is provided on this After Visit Summary.  MyChart is used to connect with patients for Virtual Visits (Telemedicine).  Patients are able to view lab/test results, encounter notes, upcoming appointments, etc.  Non-urgent messages can be sent to your provider as well.   To learn more about what you can do with MyChart, go to [email or web address].    Your next appointment:   6 month(s)  The format for your next appointment:   In Person  Provider:   You may see Sanda Klein, MD or one of the following Advanced Practice Providers on your designated Care Team:    Almyra Deforest, PA-C  Fabian Sharp, [place] or   Roby Lofts, [place]    Other Instructions Please call our office 2 months in advance to schedule your follow-up appointment with Dr. Loletha Grayer.

## 2019-11-18 DIAGNOSIS — H1033 Unspecified acute conjunctivitis, bilateral: Secondary | ICD-10-CM | POA: Diagnosis not present

## 2019-11-18 DIAGNOSIS — E782 Mixed hyperlipidemia: Secondary | ICD-10-CM | POA: Diagnosis not present

## 2019-11-18 DIAGNOSIS — E1122 Type 2 diabetes mellitus with diabetic chronic kidney disease: Secondary | ICD-10-CM | POA: Diagnosis not present

## 2019-11-18 DIAGNOSIS — K7460 Unspecified cirrhosis of liver: Secondary | ICD-10-CM | POA: Diagnosis not present

## 2019-11-18 DIAGNOSIS — L539 Erythematous condition, unspecified: Secondary | ICD-10-CM | POA: Diagnosis not present

## 2019-11-18 DIAGNOSIS — G4700 Insomnia, unspecified: Secondary | ICD-10-CM | POA: Diagnosis not present

## 2019-11-18 DIAGNOSIS — E559 Vitamin D deficiency, unspecified: Secondary | ICD-10-CM | POA: Diagnosis not present

## 2019-11-18 DIAGNOSIS — I129 Hypertensive chronic kidney disease with stage 1 through stage 4 chronic kidney disease, or unspecified chronic kidney disease: Secondary | ICD-10-CM | POA: Diagnosis not present

## 2019-11-18 DIAGNOSIS — J069 Acute upper respiratory infection, unspecified: Secondary | ICD-10-CM | POA: Diagnosis not present

## 2019-11-18 DIAGNOSIS — E7849 Other hyperlipidemia: Secondary | ICD-10-CM | POA: Diagnosis not present

## 2019-11-18 DIAGNOSIS — L719 Rosacea, unspecified: Secondary | ICD-10-CM | POA: Diagnosis not present

## 2019-11-18 DIAGNOSIS — G9009 Other idiopathic peripheral autonomic neuropathy: Secondary | ICD-10-CM | POA: Diagnosis not present

## 2019-12-22 ENCOUNTER — Other Ambulatory Visit: Payer: Self-pay

## 2020-01-01 ENCOUNTER — Other Ambulatory Visit: Payer: Self-pay | Admitting: *Deleted

## 2020-01-01 ENCOUNTER — Encounter: Payer: Self-pay | Admitting: *Deleted

## 2020-01-01 DIAGNOSIS — I10 Essential (primary) hypertension: Secondary | ICD-10-CM

## 2020-01-01 DIAGNOSIS — I428 Other cardiomyopathies: Secondary | ICD-10-CM

## 2020-01-06 ENCOUNTER — Other Ambulatory Visit: Payer: Self-pay | Admitting: Cardiovascular Disease

## 2020-02-17 DIAGNOSIS — I10 Essential (primary) hypertension: Secondary | ICD-10-CM | POA: Diagnosis not present

## 2020-02-17 DIAGNOSIS — M461 Sacroiliitis, not elsewhere classified: Secondary | ICD-10-CM | POA: Diagnosis not present

## 2020-02-17 DIAGNOSIS — Z6834 Body mass index (BMI) 34.0-34.9, adult: Secondary | ICD-10-CM | POA: Diagnosis not present

## 2020-03-04 DIAGNOSIS — E1122 Type 2 diabetes mellitus with diabetic chronic kidney disease: Secondary | ICD-10-CM | POA: Diagnosis not present

## 2020-03-04 DIAGNOSIS — E782 Mixed hyperlipidemia: Secondary | ICD-10-CM | POA: Diagnosis not present

## 2020-03-04 DIAGNOSIS — I129 Hypertensive chronic kidney disease with stage 1 through stage 4 chronic kidney disease, or unspecified chronic kidney disease: Secondary | ICD-10-CM | POA: Diagnosis not present

## 2020-03-04 DIAGNOSIS — N183 Chronic kidney disease, stage 3 unspecified: Secondary | ICD-10-CM | POA: Diagnosis not present

## 2020-03-04 DIAGNOSIS — E559 Vitamin D deficiency, unspecified: Secondary | ICD-10-CM | POA: Diagnosis not present

## 2020-03-04 DIAGNOSIS — G9009 Other idiopathic peripheral autonomic neuropathy: Secondary | ICD-10-CM | POA: Diagnosis not present

## 2020-03-04 DIAGNOSIS — M792 Neuralgia and neuritis, unspecified: Secondary | ICD-10-CM | POA: Diagnosis not present

## 2020-04-19 DIAGNOSIS — E782 Mixed hyperlipidemia: Secondary | ICD-10-CM | POA: Diagnosis not present

## 2020-04-19 DIAGNOSIS — E559 Vitamin D deficiency, unspecified: Secondary | ICD-10-CM | POA: Diagnosis not present

## 2020-04-19 DIAGNOSIS — G9009 Other idiopathic peripheral autonomic neuropathy: Secondary | ICD-10-CM | POA: Diagnosis not present

## 2020-04-19 DIAGNOSIS — E1122 Type 2 diabetes mellitus with diabetic chronic kidney disease: Secondary | ICD-10-CM | POA: Diagnosis not present

## 2020-04-19 DIAGNOSIS — I129 Hypertensive chronic kidney disease with stage 1 through stage 4 chronic kidney disease, or unspecified chronic kidney disease: Secondary | ICD-10-CM | POA: Diagnosis not present

## 2020-04-19 DIAGNOSIS — M792 Neuralgia and neuritis, unspecified: Secondary | ICD-10-CM | POA: Diagnosis not present

## 2020-04-19 DIAGNOSIS — N183 Chronic kidney disease, stage 3 unspecified: Secondary | ICD-10-CM | POA: Diagnosis not present

## 2020-04-29 DIAGNOSIS — Z794 Long term (current) use of insulin: Secondary | ICD-10-CM | POA: Diagnosis not present

## 2020-04-29 DIAGNOSIS — E119 Type 2 diabetes mellitus without complications: Secondary | ICD-10-CM | POA: Diagnosis not present

## 2020-05-16 DIAGNOSIS — J069 Acute upper respiratory infection, unspecified: Secondary | ICD-10-CM | POA: Diagnosis not present

## 2020-05-27 DIAGNOSIS — E1122 Type 2 diabetes mellitus with diabetic chronic kidney disease: Secondary | ICD-10-CM | POA: Diagnosis not present

## 2020-05-27 DIAGNOSIS — E559 Vitamin D deficiency, unspecified: Secondary | ICD-10-CM | POA: Diagnosis not present

## 2020-05-27 DIAGNOSIS — G9009 Other idiopathic peripheral autonomic neuropathy: Secondary | ICD-10-CM | POA: Diagnosis not present

## 2020-05-27 DIAGNOSIS — M792 Neuralgia and neuritis, unspecified: Secondary | ICD-10-CM | POA: Diagnosis not present

## 2020-05-27 DIAGNOSIS — E782 Mixed hyperlipidemia: Secondary | ICD-10-CM | POA: Diagnosis not present

## 2020-05-27 DIAGNOSIS — I129 Hypertensive chronic kidney disease with stage 1 through stage 4 chronic kidney disease, or unspecified chronic kidney disease: Secondary | ICD-10-CM | POA: Diagnosis not present

## 2020-05-27 DIAGNOSIS — N183 Chronic kidney disease, stage 3 unspecified: Secondary | ICD-10-CM | POA: Diagnosis not present

## 2020-07-25 DIAGNOSIS — E559 Vitamin D deficiency, unspecified: Secondary | ICD-10-CM | POA: Diagnosis not present

## 2020-07-25 DIAGNOSIS — G9009 Other idiopathic peripheral autonomic neuropathy: Secondary | ICD-10-CM | POA: Diagnosis not present

## 2020-07-25 DIAGNOSIS — N183 Chronic kidney disease, stage 3 unspecified: Secondary | ICD-10-CM | POA: Diagnosis not present

## 2020-07-25 DIAGNOSIS — E1122 Type 2 diabetes mellitus with diabetic chronic kidney disease: Secondary | ICD-10-CM | POA: Diagnosis not present

## 2020-07-25 DIAGNOSIS — M792 Neuralgia and neuritis, unspecified: Secondary | ICD-10-CM | POA: Diagnosis not present

## 2020-07-25 DIAGNOSIS — E782 Mixed hyperlipidemia: Secondary | ICD-10-CM | POA: Diagnosis not present

## 2020-08-05 DIAGNOSIS — Z794 Long term (current) use of insulin: Secondary | ICD-10-CM | POA: Diagnosis not present

## 2020-08-05 DIAGNOSIS — E119 Type 2 diabetes mellitus without complications: Secondary | ICD-10-CM | POA: Diagnosis not present

## 2020-08-11 DIAGNOSIS — K7460 Unspecified cirrhosis of liver: Secondary | ICD-10-CM | POA: Diagnosis not present

## 2020-08-11 DIAGNOSIS — Z794 Long term (current) use of insulin: Secondary | ICD-10-CM | POA: Diagnosis not present

## 2020-08-11 DIAGNOSIS — D692 Other nonthrombocytopenic purpura: Secondary | ICD-10-CM | POA: Diagnosis not present

## 2020-08-11 DIAGNOSIS — E1151 Type 2 diabetes mellitus with diabetic peripheral angiopathy without gangrene: Secondary | ICD-10-CM | POA: Diagnosis not present

## 2020-08-11 DIAGNOSIS — N183 Chronic kidney disease, stage 3 unspecified: Secondary | ICD-10-CM | POA: Diagnosis not present

## 2020-08-11 DIAGNOSIS — E261 Secondary hyperaldosteronism: Secondary | ICD-10-CM | POA: Diagnosis not present

## 2020-08-11 DIAGNOSIS — E1142 Type 2 diabetes mellitus with diabetic polyneuropathy: Secondary | ICD-10-CM | POA: Diagnosis not present

## 2020-08-11 DIAGNOSIS — E1159 Type 2 diabetes mellitus with other circulatory complications: Secondary | ICD-10-CM | POA: Diagnosis not present

## 2020-08-11 DIAGNOSIS — E1122 Type 2 diabetes mellitus with diabetic chronic kidney disease: Secondary | ICD-10-CM | POA: Diagnosis not present

## 2020-08-11 DIAGNOSIS — F1021 Alcohol dependence, in remission: Secondary | ICD-10-CM | POA: Diagnosis not present

## 2020-08-11 DIAGNOSIS — F339 Major depressive disorder, recurrent, unspecified: Secondary | ICD-10-CM | POA: Diagnosis not present

## 2020-08-11 DIAGNOSIS — I509 Heart failure, unspecified: Secondary | ICD-10-CM | POA: Diagnosis not present

## 2020-08-24 DIAGNOSIS — M792 Neuralgia and neuritis, unspecified: Secondary | ICD-10-CM | POA: Diagnosis not present

## 2020-08-24 DIAGNOSIS — E559 Vitamin D deficiency, unspecified: Secondary | ICD-10-CM | POA: Diagnosis not present

## 2020-08-24 DIAGNOSIS — E782 Mixed hyperlipidemia: Secondary | ICD-10-CM | POA: Diagnosis not present

## 2020-08-24 DIAGNOSIS — G9009 Other idiopathic peripheral autonomic neuropathy: Secondary | ICD-10-CM | POA: Diagnosis not present

## 2020-08-24 DIAGNOSIS — E1122 Type 2 diabetes mellitus with diabetic chronic kidney disease: Secondary | ICD-10-CM | POA: Diagnosis not present

## 2020-11-11 DIAGNOSIS — Z794 Long term (current) use of insulin: Secondary | ICD-10-CM | POA: Diagnosis not present

## 2020-11-11 DIAGNOSIS — E119 Type 2 diabetes mellitus without complications: Secondary | ICD-10-CM | POA: Diagnosis not present

## 2020-11-11 DIAGNOSIS — M25532 Pain in left wrist: Secondary | ICD-10-CM | POA: Diagnosis not present

## 2020-11-23 DIAGNOSIS — R350 Frequency of micturition: Secondary | ICD-10-CM | POA: Diagnosis not present

## 2020-11-23 DIAGNOSIS — B372 Candidiasis of skin and nail: Secondary | ICD-10-CM | POA: Diagnosis not present

## 2020-11-23 DIAGNOSIS — N3281 Overactive bladder: Secondary | ICD-10-CM | POA: Diagnosis not present

## 2020-12-12 DIAGNOSIS — R062 Wheezing: Secondary | ICD-10-CM | POA: Diagnosis not present

## 2020-12-12 DIAGNOSIS — U071 COVID-19: Secondary | ICD-10-CM | POA: Diagnosis not present

## 2020-12-12 DIAGNOSIS — B372 Candidiasis of skin and nail: Secondary | ICD-10-CM | POA: Diagnosis not present

## 2021-01-06 DIAGNOSIS — I10 Essential (primary) hypertension: Secondary | ICD-10-CM | POA: Diagnosis not present

## 2021-01-06 DIAGNOSIS — W19XXXA Unspecified fall, initial encounter: Secondary | ICD-10-CM | POA: Diagnosis not present

## 2021-01-06 DIAGNOSIS — Z794 Long term (current) use of insulin: Secondary | ICD-10-CM | POA: Diagnosis not present

## 2021-01-06 DIAGNOSIS — E119 Type 2 diabetes mellitus without complications: Secondary | ICD-10-CM | POA: Diagnosis not present

## 2021-01-06 DIAGNOSIS — M25421 Effusion, right elbow: Secondary | ICD-10-CM | POA: Diagnosis not present

## 2021-01-06 DIAGNOSIS — E782 Mixed hyperlipidemia: Secondary | ICD-10-CM | POA: Diagnosis not present

## 2021-01-06 DIAGNOSIS — M25521 Pain in right elbow: Secondary | ICD-10-CM | POA: Diagnosis not present

## 2021-02-07 DIAGNOSIS — E261 Secondary hyperaldosteronism: Secondary | ICD-10-CM | POA: Diagnosis not present

## 2021-02-07 DIAGNOSIS — I152 Hypertension secondary to endocrine disorders: Secondary | ICD-10-CM | POA: Diagnosis not present

## 2021-02-07 DIAGNOSIS — E1142 Type 2 diabetes mellitus with diabetic polyneuropathy: Secondary | ICD-10-CM | POA: Diagnosis not present

## 2021-02-07 DIAGNOSIS — I509 Heart failure, unspecified: Secondary | ICD-10-CM | POA: Diagnosis not present

## 2021-02-07 DIAGNOSIS — Z794 Long term (current) use of insulin: Secondary | ICD-10-CM | POA: Diagnosis not present

## 2021-02-07 DIAGNOSIS — E1159 Type 2 diabetes mellitus with other circulatory complications: Secondary | ICD-10-CM | POA: Diagnosis not present

## 2021-02-07 DIAGNOSIS — M25512 Pain in left shoulder: Secondary | ICD-10-CM | POA: Diagnosis not present

## 2021-02-07 DIAGNOSIS — Z6832 Body mass index (BMI) 32.0-32.9, adult: Secondary | ICD-10-CM | POA: Diagnosis not present

## 2021-02-07 DIAGNOSIS — F339 Major depressive disorder, recurrent, unspecified: Secondary | ICD-10-CM | POA: Diagnosis not present

## 2021-02-07 DIAGNOSIS — N183 Chronic kidney disease, stage 3 unspecified: Secondary | ICD-10-CM | POA: Diagnosis not present

## 2021-02-07 DIAGNOSIS — E1122 Type 2 diabetes mellitus with diabetic chronic kidney disease: Secondary | ICD-10-CM | POA: Diagnosis not present

## 2021-02-07 DIAGNOSIS — D692 Other nonthrombocytopenic purpura: Secondary | ICD-10-CM | POA: Diagnosis not present

## 2021-02-17 DIAGNOSIS — E119 Type 2 diabetes mellitus without complications: Secondary | ICD-10-CM | POA: Diagnosis not present

## 2021-02-17 DIAGNOSIS — Z794 Long term (current) use of insulin: Secondary | ICD-10-CM | POA: Diagnosis not present

## 2021-03-17 DIAGNOSIS — E1165 Type 2 diabetes mellitus with hyperglycemia: Secondary | ICD-10-CM | POA: Diagnosis not present

## 2021-05-03 DIAGNOSIS — E1165 Type 2 diabetes mellitus with hyperglycemia: Secondary | ICD-10-CM | POA: Diagnosis not present

## 2021-05-19 DIAGNOSIS — E119 Type 2 diabetes mellitus without complications: Secondary | ICD-10-CM | POA: Diagnosis not present

## 2021-05-19 DIAGNOSIS — Z794 Long term (current) use of insulin: Secondary | ICD-10-CM | POA: Diagnosis not present

## 2021-06-09 DIAGNOSIS — E119 Type 2 diabetes mellitus without complications: Secondary | ICD-10-CM | POA: Diagnosis not present

## 2021-06-09 DIAGNOSIS — I5032 Chronic diastolic (congestive) heart failure: Secondary | ICD-10-CM | POA: Diagnosis not present

## 2021-06-09 DIAGNOSIS — H53452 Other localized visual field defect, left eye: Secondary | ICD-10-CM | POA: Diagnosis not present

## 2021-06-09 DIAGNOSIS — M25512 Pain in left shoulder: Secondary | ICD-10-CM | POA: Diagnosis not present

## 2021-06-09 DIAGNOSIS — Z794 Long term (current) use of insulin: Secondary | ICD-10-CM | POA: Diagnosis not present

## 2021-06-09 DIAGNOSIS — G8929 Other chronic pain: Secondary | ICD-10-CM | POA: Diagnosis not present

## 2021-06-27 ENCOUNTER — Other Ambulatory Visit: Payer: Self-pay | Admitting: Cardiovascular Disease

## 2021-08-04 ENCOUNTER — Other Ambulatory Visit: Payer: Self-pay | Admitting: Family Medicine

## 2021-08-04 ENCOUNTER — Other Ambulatory Visit (HOSPITAL_COMMUNITY): Payer: Self-pay | Admitting: Family Medicine

## 2021-08-04 DIAGNOSIS — K7460 Unspecified cirrhosis of liver: Secondary | ICD-10-CM | POA: Diagnosis not present

## 2021-08-04 DIAGNOSIS — G629 Polyneuropathy, unspecified: Secondary | ICD-10-CM | POA: Diagnosis not present

## 2021-08-04 DIAGNOSIS — R32 Unspecified urinary incontinence: Secondary | ICD-10-CM

## 2021-08-04 DIAGNOSIS — N183 Chronic kidney disease, stage 3 unspecified: Secondary | ICD-10-CM | POA: Diagnosis not present

## 2021-08-04 DIAGNOSIS — I129 Hypertensive chronic kidney disease with stage 1 through stage 4 chronic kidney disease, or unspecified chronic kidney disease: Secondary | ICD-10-CM | POA: Diagnosis not present

## 2021-08-07 ENCOUNTER — Other Ambulatory Visit (HOSPITAL_COMMUNITY): Payer: Self-pay | Admitting: Family Medicine

## 2021-08-07 DIAGNOSIS — R32 Unspecified urinary incontinence: Secondary | ICD-10-CM

## 2021-08-10 DIAGNOSIS — H469 Unspecified optic neuritis: Secondary | ICD-10-CM | POA: Diagnosis not present

## 2021-08-11 ENCOUNTER — Ambulatory Visit (HOSPITAL_COMMUNITY)
Admission: RE | Admit: 2021-08-11 | Discharge: 2021-08-11 | Disposition: A | Discharge status: Home / Self Care | Payer: PPO | Source: Ambulatory Visit | Attending: Family Medicine | Admitting: Family Medicine

## 2021-08-11 ENCOUNTER — Other Ambulatory Visit: Payer: Self-pay

## 2021-08-11 DIAGNOSIS — I10 Essential (primary) hypertension: Secondary | ICD-10-CM | POA: Diagnosis not present

## 2021-08-11 DIAGNOSIS — E119 Type 2 diabetes mellitus without complications: Secondary | ICD-10-CM | POA: Diagnosis not present

## 2021-08-11 DIAGNOSIS — Z0189 Encounter for other specified special examinations: Secondary | ICD-10-CM | POA: Insufficient documentation

## 2021-08-11 DIAGNOSIS — R32 Unspecified urinary incontinence: Secondary | ICD-10-CM

## 2021-08-11 DIAGNOSIS — I6523 Occlusion and stenosis of bilateral carotid arteries: Secondary | ICD-10-CM | POA: Diagnosis not present

## 2021-08-11 DIAGNOSIS — K7689 Other specified diseases of liver: Secondary | ICD-10-CM | POA: Diagnosis not present

## 2021-08-11 DIAGNOSIS — N3289 Other specified disorders of bladder: Secondary | ICD-10-CM | POA: Insufficient documentation

## 2021-08-11 DIAGNOSIS — E785 Hyperlipidemia, unspecified: Secondary | ICD-10-CM | POA: Diagnosis not present

## 2021-08-11 DIAGNOSIS — K7460 Unspecified cirrhosis of liver: Secondary | ICD-10-CM | POA: Diagnosis not present

## 2021-08-11 DIAGNOSIS — I779 Disorder of arteries and arterioles, unspecified: Secondary | ICD-10-CM | POA: Insufficient documentation

## 2021-09-05 ENCOUNTER — Ambulatory Visit (HOSPITAL_COMMUNITY): Payer: PPO | Attending: Family Medicine

## 2021-09-05 ENCOUNTER — Encounter (HOSPITAL_COMMUNITY): Payer: Self-pay

## 2021-09-05 DIAGNOSIS — G8929 Other chronic pain: Secondary | ICD-10-CM | POA: Diagnosis not present

## 2021-09-05 DIAGNOSIS — R29898 Other symptoms and signs involving the musculoskeletal system: Secondary | ICD-10-CM | POA: Diagnosis not present

## 2021-09-05 DIAGNOSIS — M25512 Pain in left shoulder: Secondary | ICD-10-CM | POA: Insufficient documentation

## 2021-09-05 DIAGNOSIS — M25612 Stiffness of left shoulder, not elsewhere classified: Secondary | ICD-10-CM | POA: Diagnosis not present

## 2021-09-05 NOTE — Patient Instructions (Signed)
?  Pain management:  ?Placing a heating pad on the left shoulder as needed for pain management for 10-15 minutes.  ? ? ? ?1) SHOULDER: Flexion On Table ? ? ?Place hands on towel placed on table, elbows straight. Lean forward with you upper body, pushing towel away from body.  _10-15__ reps per set,  ? ?2) Abduction (Passive) ? ? ?With arm out to side, resting on towel placed on table with palm DOWN, keeping trunk away from table, lean to the side while pushing towel away from body.  ?Repeat _10-15___ times.  ?Copyright ? VHI. All rights reserved.  ? ? ? ?3) Internal Rotation (Assistive) ? ? ?Seated with elbow bent at right angle and held against side, slide arm on table surface in an inward arc keeping elbow anchored in place. ?Repeat __10-15__ times. Activity: Use this motion to brush crumbs off the table. ? ?Copyright ? VHI. All rights reserved.   ?

## 2021-09-05 NOTE — Therapy (Addendum)
?OUTPATIENT OCCUPATIONAL THERAPY ORTHO EVALUATION ? ?Patient Name: [name] [name] ?MRN: [id number] ?DOB:09-22-1950, 71 y.o., male ?Today's Date: 09/05/2021 ? ?PCP: Celene Squibb, MD ?REFERRING PROVIDER: Meltontate, Gillis Santa, MD ? ? OT End of Session - 09/05/21 1508   ? ? Visit Number 1   ? Number of Visits 12   ? Date for OT Re-Evaluation 10/17/21   ? Authorization Type [employer]   ? Authorization Time Period [facility] approved 15 vists (09/05/21-01/03/22   ? Authorization - Visit Number 1   ? Authorization - Number of Visits 15   ? Progress Note Due on Visit 10   ? OT Start Time 1430   ? OT Stop Time 1500   ? OT Time Calculation (min) 30 min   ? Activity Tolerance Patient tolerated treatment well   ? Behavior During Therapy [facility] for tasks assessed/performed   ? ?  ?  ? ?  ? ? ?Past Medical History:  ?Diagnosis Date  ? Anxiety   ? Arthritis   ? CAD (coronary artery disease) cath in 2011  ? CAD in native artery 05/18/2014  ? Cellulitis of toe, right july 2012  ? Chronic diastolic heart failure ([place]) 05/18/2014  ? Cirrhosis of liver ([place])   ? ? ETOH, fatty liver   ? Depression   ? Diabetes mellitus   ? Hyperlipidemia 05/18/2014  ? Hypertension   ? Nonischemic cardiomyopathy ([place]) 05/18/2014  ? ?Past Surgical History:  ?Procedure Laterality Date  ? ANTERIOR CERVICAL DECOMP/DISCECTOMY FUSION N/A 06/14/2017  ? Procedure: ANTERIOR CERVICAL DECOMPRESSION/DISCECTOMY FUSION CERVICAL 4- CERVICAL 5;  Surgeon: Earnie Larsson, MD;  Location: [facility];  Service: Neurosurgery;  Laterality: N/A;  ANTERIOR CERVICAL DECOMPRESSION/DISCECTOMY FUSION CERVICAL 4- CERVICAL 5  ? CARDIAC CATHETERIZATION  05/24/2010  ? Carotid duplex  05/23/2010  ? CARPAL TUNNEL RELEASE    ? CERVICAL SPINE SURGERY    ? COLONOSCOPY  FEB 2010 ARS NUR [place]  ? NL TCS  ? COLONOSCOPY N/A 08/01/2015  ? Dr. Oneida Alar: small internal hemorrhoids, next TCS in 10 years   ? DOPPLER ECHOCARDIOGRAPHY  05/09/2010  ? EF 45-50%  ? ESOPHAGOGASTRODUODENOSCOPY  02/07/2011  ? Dr. Oneida Alar:  H.pylori gastritis, mild portal gastropathy, treatmetn with Amoxicillin/Biaxin   ? ESOPHAGOGASTRODUODENOSCOPY N/A 08/01/2015  ? Dr. Oneida Alar: Reflux esophagitis, mild gastritis, next EGD in 3 years   ? lower ext atrerial duplex  05/23/2010  ? NM MYOVIEW LTD  01/08/2008  ? EF 53%  Low risk scan  ? sleep study  01/01/2008  ? ?Patient Active Problem List  ? Diagnosis Date Noted  ? Cervical spinal stenosis 06/14/2017  ? Reflux esophagitis 03/22/2017  ? Anemia due to other cause 01/14/2016  ? Special screening for malignant neoplasms, colon   ? Cirrhosis of liver without ascites ([place]) 07/14/2015  ? Loose stools 07/14/2015  ? Chest tightness   ? CAD in native artery 05/18/2014  ? Hyperlipidemia 05/18/2014  ? Nonischemic cardiomyopathy ([place]) 05/18/2014  ? Precordial chest pain 05/18/2014  ? Chronic diastolic heart failure ([place]) 05/18/2014  ? Helicobacter pylori gastritis 02/14/2011  ? Cirrhosis ([place]) 01/10/2011  ? Diabetes mellitus 01/10/2011  ? Cellulitis of great toe of right foot 01/10/2011  ? ? ?ONSET DATE: ongoing for at least a year. ? ?REFERRING DIAG: chronic left shoulder pain ? ?THERAPY DIAG:  ?Other symptoms and signs involving the musculoskeletal system ? ?Chronic left shoulder pain ? ?Stiffness of left shoulder, not elsewhere classified ? ?SUBJECTIVE:  ? ?SUBJECTIVE STATEMENT: ?S: It's been progressively getting  worse over time.  ?Pt accompanied by: self ? ?PERTINENT HISTORY: Left Shoulder pain and decreased range of motion for 6 months per Dr. Ronnie Derby . ? ?PRECAUTIONS: None ? ?WEIGHT BEARING RESTRICTIONS No ? ?PAIN:  ?Are you having pain? Yes: NPRS scale: 1/10 At night when trying to sleep 4 or 5/10 ?Pain location: left shoulder ?Pain description: sore ?Aggravating factors: laying down trying to sleep, using it ?Relieving factors: nothing has helped.  ? ?FALLS: Has patient fallen in last 6 months? Yes. Number of falls 1 ? ?LIVING ENVIRONMENT: ?Lives with: lives alone ? ?PLOF: Independent ? ?PATIENT GOALS   Able to move arm better.  ? ?OBJECTIVE:  ? ?HAND DOMINANCE: Right ? ?ADLs: ?Overall ADLs: Difficulty with all reaching behind back, out to the side, behind his head, above head, lifting.  ? ? ?FUNCTIONAL OUTCOME MEASURES: ?FOTO: complete next session ? ?UE ROM    ? ?Active ROM ?IR/er adducted- seated Left ?09/05/2021  ?Shoulder flexion 77  ?Shoulder abduction 56  ?Shoulder internal rotation 90  ?Shoulder external rotation 20  ?(Blank rows = not tested) ? ?   ?Passive ROM ?IR/er adducted - supine Left ?09/05/2021  ?Shoulder flexion 92  ?Shoulder abduction 95  ?Shoulder internal rotation 90  ?Shoulder external rotation 20  ?(Blank rows = not tested) ? ?UE MMT:    ? ?MMT- seated. IR/er adducted Left ?09/05/2021  ?Shoulder flexion 3-/5  ?Shoulder abduction 3-/5  ?Shoulder internal rotation 3/5  ?Shoulder external rotation 3-/5  ?(Blank rows = not tested) ? ? ?COGNITION: ?Overall cognitive status: Within functional limits for tasks assessed ? ? ?OBSERVATIONS: Max fascial restrictions noted in the left upper arm, upper trapezius, and scapularis region.  ? ? ? ? ? ?PATIENT EDUCATION: ?Education details: table slides, using heat for pain management ?Person educated: Patient ?Education method: Explanation, Demonstration, Verbal cues, and Handouts ?Education comprehension: verbalized understanding and returned demonstration ? ? ?HOME EXERCISE PROGRAM: ?Eval: table slides ? ?GOALS: ? ? ?SHORT TERM GOALS: Target date: 09/26/2021 ? ?Pt will be educated and independent with HEP in order to facilitate his progress in therapy and begin to use his left arm for all daily tasks.  ?Baseline: ?Goal status: INITIAL ? ?2.  Pt will increase his LUE P/ROM to WNL in order to increase ability to completing dressing tasks with less difficulty.  ?Baseline:  ?Goal status: INITIAL ? ?3.  Patient will decrease his left UE fascial restrictions to moderate amount to increase his functional mobility needed to complete low level reaching tasks.  ?Baseline:   ?Goal status: INITIAL ? ?4.  Patient will report decreased pain at night when attempting to sleep while utilizing arm positioning techniques.  ?Baseline:  ?Goal status: INITIAL ? ?LONG TERM GOALS: Target date: 10/17/2021 ? ?Pt will increase his LUE A/ROM to [facility] in order to complete reaching tasks above his head with less difficulty.  ?Baseline:  ?Goal status: INITIAL ? ?2.  Pt will increase his LUE strength to 4+/5 in order to return to lifting and managing items of moderate weight with both hands.  ?Baseline:  ?Goal status: INITIAL ? ?3.  Patient will report a decrease in pain level when using his left UE for daily tasks and when sleeping of approximately 2/10 or less.  ?Baseline:  ?Goal status: INITIAL ? ?4.  Pt will decrease his left UE fascial restrictions to min amount or less in order to facilitate his functional mobility needed to complete high level reaching tasks.  ?Baseline:  ?Goal status: INITIAL ? ? ?ASSESSMENT: ? ?  CLINICAL IMPRESSION: ?Patient is a 71 y.o. male who was seen today for occupational therapy evaluation for chronic left shoulder pain causing increased fascial restrictions, and decreased ROM and strength resulting in severe difficulty completing any daily takes using his LU.  ? ?PERFORMANCE DEFICITS in functional skills including ADLs, IADLs, ROM, strength, pain, fascial restrictions, flexibility, and UE functional use ? ?IMPAIRMENTS are limiting patient from ADLs, IADLs, and rest and sleep.  ? ?COMORBIDITIES may have co-morbidities  that affects occupational performance. Patient will benefit from skilled OT to address above impairments and improve overall function. ? ?MODIFICATION OR ASSISTANCE TO COMPLETE EVALUATION: No modification of tasks or assist necessary to complete an evaluation. ? ?OT OCCUPATIONAL PROFILE AND HISTORY: Problem focused assessment: Including review of records relating to presenting problem. ? ?CLINICAL DECISION MAKING: Moderate - several treatment options, min-mod  task modification necessary ? ?REHAB POTENTIAL: Good ? ?EVALUATION COMPLEXITY: Moderate ? ? ? ? ? ?PLAN: ?OT FREQUENCY: 2x/week ? ?OT DURATION: 6 weeks ? ?PLANNED INTERVENTIONS: self care/ADL training, the

## 2021-09-15 DIAGNOSIS — R109 Unspecified abdominal pain: Secondary | ICD-10-CM | POA: Diagnosis not present

## 2021-09-18 ENCOUNTER — Emergency Department (HOSPITAL_COMMUNITY): Payer: No Typology Code available for payment source

## 2021-09-18 ENCOUNTER — Other Ambulatory Visit: Payer: Self-pay

## 2021-09-18 ENCOUNTER — Encounter (HOSPITAL_COMMUNITY): Payer: Self-pay | Admitting: *Deleted

## 2021-09-18 ENCOUNTER — Emergency Department (HOSPITAL_COMMUNITY)
Admission: EM | Admit: 2021-09-18 | Discharge: 2021-09-18 | Disposition: A | Discharge status: Home / Self Care | Payer: No Typology Code available for payment source | Attending: Emergency Medicine | Admitting: Emergency Medicine

## 2021-09-18 DIAGNOSIS — Z79899 Other long term (current) drug therapy: Secondary | ICD-10-CM | POA: Insufficient documentation

## 2021-09-18 DIAGNOSIS — I2510 Atherosclerotic heart disease of native coronary artery without angina pectoris: Secondary | ICD-10-CM | POA: Insufficient documentation

## 2021-09-18 DIAGNOSIS — D72829 Elevated white blood cell count, unspecified: Secondary | ICD-10-CM | POA: Insufficient documentation

## 2021-09-18 DIAGNOSIS — R112 Nausea with vomiting, unspecified: Secondary | ICD-10-CM | POA: Diagnosis not present

## 2021-09-18 DIAGNOSIS — R1013 Epigastric pain: Secondary | ICD-10-CM | POA: Insufficient documentation

## 2021-09-18 DIAGNOSIS — Z7982 Long term (current) use of aspirin: Secondary | ICD-10-CM | POA: Insufficient documentation

## 2021-09-18 DIAGNOSIS — I10 Essential (primary) hypertension: Secondary | ICD-10-CM | POA: Insufficient documentation

## 2021-09-18 DIAGNOSIS — R109 Unspecified abdominal pain: Secondary | ICD-10-CM | POA: Diagnosis present

## 2021-09-18 DIAGNOSIS — Z794 Long term (current) use of insulin: Secondary | ICD-10-CM | POA: Diagnosis not present

## 2021-09-18 DIAGNOSIS — R197 Diarrhea, unspecified: Secondary | ICD-10-CM | POA: Diagnosis not present

## 2021-09-18 DIAGNOSIS — R000 Tachycardia, unspecified: Secondary | ICD-10-CM | POA: Diagnosis not present

## 2021-09-18 DIAGNOSIS — Z7984 Long term (current) use of oral hypoglycemic drugs: Secondary | ICD-10-CM | POA: Insufficient documentation

## 2021-09-18 DIAGNOSIS — E1165 Type 2 diabetes mellitus with hyperglycemia: Secondary | ICD-10-CM | POA: Insufficient documentation

## 2021-09-18 DIAGNOSIS — R748 Abnormal levels of other serum enzymes: Secondary | ICD-10-CM | POA: Diagnosis not present

## 2021-09-18 LAB — CBC WITH DIFFERENTIAL/PLATELET
Abs Immature Granulocytes: 0.06 10*3/uL (ref 0.00–0.07)
Basophils Absolute: 0.1 10*3/uL (ref 0.0–0.1)
Basophils Relative: 1 %
Eosinophils Absolute: 0.2 10*3/uL (ref 0.0–0.5)
Eosinophils Relative: 2 %
HCT: 48.9 % (ref 39.0–52.0)
Hemoglobin: 17 g/dL (ref 13.0–17.0)
Immature Granulocytes: 1 %
Lymphocytes Relative: 22 %
Lymphs Abs: 2.5 10*3/uL (ref 0.7–4.0)
MCH: 32.6 pg (ref 26.0–34.0)
MCHC: 34.8 g/dL (ref 30.0–36.0)
MCV: 93.7 fL (ref 80.0–100.0)
Monocytes Absolute: 0.7 10*3/uL (ref 0.1–1.0)
Monocytes Relative: 6 %
Neutro Abs: 8 10*3/uL — ABNORMAL HIGH (ref 1.7–7.7)
Neutrophils Relative %: 68 %
Platelets: 216 10*3/uL (ref 150–400)
RBC: 5.22 MIL/uL (ref 4.22–5.81)
RDW: 12.5 % (ref 11.5–15.5)
WBC: 11.5 10*3/uL — ABNORMAL HIGH (ref 4.0–10.5)
nRBC: 0 % (ref 0.0–0.2)

## 2021-09-18 LAB — COMPREHENSIVE METABOLIC PANEL
ALT: 21 U/L (ref 0–44)
AST: 16 U/L (ref 15–41)
Albumin: 4.1 g/dL (ref 3.5–5.0)
Alkaline Phosphatase: 91 U/L (ref 38–126)
Anion gap: 12 (ref 5–15)
BUN: 26 mg/dL — ABNORMAL HIGH (ref 8–23)
CO2: 23 mmol/L (ref 22–32)
Calcium: 9.3 mg/dL (ref 8.9–10.3)
Chloride: 100 mmol/L (ref 98–111)
Creatinine, Ser: 1.15 mg/dL (ref 0.61–1.24)
GFR, Estimated: >60 mL/min (ref >60)
Glucose, Bld: 383 mg/dL — ABNORMAL HIGH (ref 70–99)
Potassium: 4.1 mmol/L (ref 3.5–5.1)
Sodium: 135 mmol/L (ref 135–145)
Total Bilirubin: 0.8 mg/dL (ref 0.3–1.2)
Total Protein: 7.9 g/dL (ref 6.5–8.1)

## 2021-09-18 LAB — LIPASE, BLOOD: Lipase: 80 U/L — ABNORMAL HIGH (ref 11–51)

## 2021-09-18 MED ORDER — ONDANSETRON HCL 4 MG/2ML IJ SOLN
4.0000 mg | Freq: Once | INTRAMUSCULAR | Status: AC
  Administered 2021-09-18: 4 mg via INTRAVENOUS
  Filled 2021-09-18: qty 2

## 2021-09-18 MED ORDER — LIDOCAINE VISCOUS HCL 2 % MT SOLN
15.0000 mL | Freq: Once | OROMUCOSAL | Status: AC
  Administered 2021-09-18: 15 mL via ORAL
  Filled 2021-09-18: qty 15

## 2021-09-18 MED ORDER — MORPHINE SULFATE (PF) 4 MG/ML IV SOLN
4.0000 mg | Freq: Once | INTRAVENOUS | Status: AC
  Administered 2021-09-18: 4 mg via INTRAVENOUS
  Filled 2021-09-18: qty 1

## 2021-09-18 MED ORDER — METHOCARBAMOL 500 MG PO TABS: 500.0000 mg | ORAL_TABLET | Freq: Two times a day (BID) | ORAL | 0 refills | Status: AC

## 2021-09-18 MED ORDER — ALUM & MAG HYDROXIDE-SIMETH 200-200-20 MG/5ML PO SUSP
30.0000 mL | Freq: Once | ORAL | Status: AC
  Administered 2021-09-18: 30 mL via ORAL
  Filled 2021-09-18: qty 30

## 2021-09-18 MED ORDER — IOHEXOL 300 MG/ML  SOLN
100.0000 mL | Freq: Once | INTRAMUSCULAR | Status: AC | PRN
  Administered 2021-09-18: 100 mL via INTRAVENOUS

## 2021-09-18 MED ORDER — SODIUM CHLORIDE 0.9 % IV BOLUS
1000.0000 mL | Freq: Once | INTRAVENOUS | Status: AC
  Administered 2021-09-18: 1000 mL via INTRAVENOUS

## 2021-09-18 NOTE — ED Provider Notes (Signed)
?[facility] ?Provider Note ? ? ?CSN: [id number] ?Arrival date & time: 09/18/21  1408 ? ?  ? ?History ? ?Chief Complaint  ?Patient presents with  ? Abdominal Pain  ? ? ?[name] [name] is a 71 y.o. male. ? ?71 year old male with past medical history significant for CAD, esophagitis, diabetes presents today for evaluation of abdominal pain of about 6-day duration.  He states this started last Wednesday.  He describes this as a cramping sensation that gets worse when lying down and improves when he sits up.  Denies associated fever, chills.  Does state that he had some nausea with one episode of emesis.  Denies dysuria, constipation.  States he has had some episodes of loose stools daily for the past several days.  States he was recently evaluated at urgent care and was told he has some constipation.  Has not noted improvement since.  Has not taken a bowel regimen.  He called his primary care provider and had a PPI prescribed without improvement in symptoms.  States he drinks alcohol socially.  Last drink about 2 weeks ago. ? ?The history is provided by the patient. No language interpreter was used.  ? ?  ? ?Home Medications ?Prior to Admission medications   ?Medication Sig Start Date End Date Taking? Authorizing Provider  ?aspirin 81 MG tablet Take 81 mg by mouth daily.      [provider]  ?carvedilol (COREG) 25 MG tablet Take 12.5 mg by mouth 2 (two) times daily with a meal.    [provider]  ?cyclobenzaprine (FLEXERIL) 10 MG tablet Take 1 tablet (10 mg total) by mouth 3 (three) times daily as needed for muscle spasms. 06/15/17   Kristeen Miss, MD  ?desvenlafaxine (PRISTIQ) 100 MG 24 hr tablet Take 100 mg by mouth daily.    [provider]  ?empagliflozin (JARDIANCE) 10 MG TABS tablet Take 10 mg by mouth daily.    [provider]  ?fish oil-omega-3 fatty acids 1000 MG capsule Take 1 g by mouth daily.     [provider]  ?fluocinonide cream (LIDEX) 4.03  % Apply 1 application topically daily.     [provider]  ?gabapentin (NEURONTIN) 600 MG tablet Take 600 mg by mouth 2 (two) times daily.     [provider]  ?glyBURIDE (DIABETA) 5 MG tablet Take 5 mg by mouth at bedtime. 11/03/19   [provider]  ?HYDROcodone-acetaminophen (NORCO) 10-325 MG tablet Take 1-2 tablets by mouth every 4 (four) hours as needed for severe pain ((score 7 to 10)). 06/15/17   Kristeen Miss, MD  ?insulin aspart (NOVOLOG) 100 UNIT/ML injection Inject 10 Units into the skin 3 (three) times daily before meals.    [provider]  ?insulin glargine (LANTUS) 100 UNIT/ML injection Inject 50 Units into the skin 2 (two) times daily.     [provider]  ?naproxen sodium (ALEVE) 220 MG tablet Take 220 mg by mouth daily as needed (for pain or headache).    [provider]  ?pantoprazole (PROTONIX) 40 MG tablet Take 1 tablet (40 mg total) by mouth 2 (two) times daily before a meal. 03/22/17   Annitta Needs, NP  ?rosuvastatin (CRESTOR) 10 MG tablet TAKE ONE (1) TABLET EACH DAY 01/06/20   Croitoru, Mihai, MD  ?sildenafil (VIAGRA) 100 MG tablet Take 100 mg by mouth daily as needed for erectile dysfunction.    [provider]  ?traZODone (DESYREL) 100 MG tablet Take 200 mg by  mouth at bedtime.     [provider]  ?   ? ?Allergies    ?Glipizide and Naprosyn [naproxen]   ? ?Review of Systems   ?Review of Systems  ?Constitutional:  Negative for appetite change, chills and fever.  ?Respiratory:  Negative for shortness of breath.   ?Cardiovascular:  Negative for chest pain.  ?Gastrointestinal:  Positive for abdominal pain, diarrhea, nausea and vomiting.  ?Genitourinary:  Negative for difficulty urinating and dysuria.  ?Neurological:  Negative for weakness and headaches.  ?All other systems reviewed and are negative. ? ?Physical Exam ?Updated Vital Signs ?BP 130/75 (BP Location: Right Arm)   Pulse (!) 105   Temp 98.3 ?F (36.8 ?C) (Oral)    Resp 18   Ht '5\' 6"'$  (1.676 m)   Wt 88.5 kg   SpO2 97%   BMI 31.47 kg/m?  ?Physical Exam ?Vitals and nursing note reviewed.  ?Constitutional:   ?   General: He is not in acute distress. ?   Appearance: Normal appearance. He is not ill-appearing.  ?HENT:  ?   Head: Normocephalic and atraumatic.  ?   Nose: Nose normal.  ?Eyes:  ?   General: No scleral icterus. ?   Extraocular Movements: Extraocular movements intact.  ?   Conjunctiva/sclera: Conjunctivae normal.  ?Cardiovascular:  ?   Rate and Rhythm: Normal rate and regular rhythm.  ?   Pulses: Normal pulses.  ?   Heart sounds: Normal heart sounds.  ?Pulmonary:  ?   Effort: Pulmonary effort is normal. No respiratory distress.  ?   Breath sounds: Normal breath sounds. No wheezing or rales.  ?Abdominal:  ?   General: There is no distension.  ?   Tenderness: There is abdominal tenderness (Epigastric area). There is no right CVA tenderness, left CVA tenderness or guarding.  ?Musculoskeletal:     ?   General: Normal range of motion.  ?   Cervical back: Normal range of motion.  ?   Right lower leg: No edema.  ?   Left lower leg: No edema.  ?Skin: ?   General: Skin is warm and dry.  ?Neurological:  ?   General: No focal deficit present.  ?   Mental Status: He is alert. Mental status is at baseline.  ? ? ?ED Results / Procedures / Treatments   ?Labs ?(all labs ordered are listed, but only abnormal results are displayed) ?Labs Reviewed  ?CBC WITH DIFFERENTIAL/PLATELET  ?COMPREHENSIVE METABOLIC PANEL  ?LIPASE, BLOOD  ?URINALYSIS, ROUTINE W REFLEX MICROSCOPIC  ? ? ?EKG ?None ? ?Radiology ?No results found. ? ?Procedures ?Procedures  ? ? ?Medications Ordered in ED ?Medications  ?sodium chloride 0.9 % bolus 1,000 mL (has no administration in time range)  ?morphine (PF) 4 MG/ML injection 4 mg (has no administration in time range)  ?ondansetron (ZOFRAN) injection 4 mg (has no administration in time range)  ? ? ?ED Course/ Medical Decision Making/ A&P ?  ?                         ?Medical Decision Making ?Amount and/or Complexity of Data Reviewed ?Labs: ordered. ?Radiology: ordered. ? ?Risk ?OTC drugs. ?Prescription drug management. ? ? ?Medical Decision Making / ED Course ? ? ?This patient presents to the ED for concern of abdominal pain, this involves an extensive number of treatment options, and is a complaint that carries with it a high risk of complications and morbidity.  The differential diagnosis includes appendicitis, pancreatitis, cholecystitis,  nephrolithiasis, gastroenteritis, constipation ? ?MDM: ?71 year old male with past medical history of diabetes, gastritis, CAD presents today for evaluation of abdominal pain for about 6-day duration.  He has taken PPI without improvement.  He was evaluated at urgent care and was told he was constipated.  States he had 1 episode of emesis which she states he passively induced to see if it would help with his symptoms and it did not.  Will evaluate with CBC, CMP, lipase, UA, CT abdomen pelvis.  Will provide IV hydration, GI cocktail, morphine, Zofran.  Patient initially documented to be tachycardic at 105.  During my eval patient with a heart rate of 90-95.  Without acute distress. ? ?CBC with mild leukocytosis without left shift this is likely reactive.  CMP with glucose of 383 without anion gap or signs of DKA.  Otherwise unremarkable.  Lipase mildly elevated at 80.  CT without evidence of acute intra-abdominal process.  No evidence of pancreatitis.  Given patient's pain is located to her right mid lower abdomen, and is tender to palpation this is likely an abdominal wall pain.  Given patient's age and history of gastritis and since would not be a smart choice at this time.  We will provide Robaxin.  Plan was discussed with patient, his daughter, and another family member who is a Designer, jewellery.  They will voiced understanding and are in agreement with plan.  Patient will follow-up with his PCP for reevaluation or return for any  worsening symptoms. ? ? ?Lab Tests: ?-I ordered, reviewed, and interpreted labs.   ?The pertinent results include:   ?Labs Reviewed  ?CBC WITH DIFFERENTIAL/PLATELET  ?COMPREHENSIVE METABOLIC PANEL  ?LIPASE, BLOOD

## 2021-09-18 NOTE — ED Triage Notes (Signed)
Pt c/o abdominal pain x 2 weeks; pt states the pain is worse when he lies down and describes it as a cramping feeling ?

## 2021-09-18 NOTE — Discharge Instructions (Addendum)
Your work-up today was reassuring against a serious cause of your abdominal pain.  CT scan, blood work did not show any concerns.  Given that your pain is located in that 1 spot on the right side of your abdomen, and is worse with pushing, or extending your abdomen this is likely musculoskeletal in nature.  We did discuss using anti-inflammatories but however given your age and, history of gastritis this could be harming the lining of your stomach.  Instead we decided to use muscle relaxer.  Have sent this into the pharmacy for you.  I recommend you call and schedule an appointment for evaluation with your primary care provider.  If you have any worsening in symptoms please return to the emergency room for evaluation. ?

## 2021-09-26 DIAGNOSIS — R1011 Right upper quadrant pain: Secondary | ICD-10-CM | POA: Diagnosis not present

## 2021-09-26 DIAGNOSIS — E1142 Type 2 diabetes mellitus with diabetic polyneuropathy: Secondary | ICD-10-CM | POA: Diagnosis not present

## 2021-09-26 DIAGNOSIS — N401 Enlarged prostate with lower urinary tract symptoms: Secondary | ICD-10-CM | POA: Diagnosis not present

## 2021-09-26 DIAGNOSIS — H1033 Unspecified acute conjunctivitis, bilateral: Secondary | ICD-10-CM | POA: Diagnosis not present

## 2021-09-26 DIAGNOSIS — E1165 Type 2 diabetes mellitus with hyperglycemia: Secondary | ICD-10-CM | POA: Diagnosis not present

## 2021-09-26 DIAGNOSIS — I700 Atherosclerosis of aorta: Secondary | ICD-10-CM | POA: Diagnosis not present

## 2021-09-26 DIAGNOSIS — Z794 Long term (current) use of insulin: Secondary | ICD-10-CM | POA: Diagnosis not present

## 2021-09-26 DIAGNOSIS — R351 Nocturia: Secondary | ICD-10-CM | POA: Diagnosis not present

## 2021-09-26 DIAGNOSIS — K7460 Unspecified cirrhosis of liver: Secondary | ICD-10-CM | POA: Diagnosis not present

## 2021-09-26 DIAGNOSIS — R531 Weakness: Secondary | ICD-10-CM | POA: Diagnosis not present

## 2021-09-26 DIAGNOSIS — Z1211 Encounter for screening for malignant neoplasm of colon: Secondary | ICD-10-CM | POA: Diagnosis not present

## 2021-09-29 ENCOUNTER — Ambulatory Visit (HOSPITAL_COMMUNITY): Payer: No Typology Code available for payment source | Admitting: Occupational Therapy

## 2021-10-04 ENCOUNTER — Encounter (HOSPITAL_COMMUNITY): Payer: No Typology Code available for payment source

## 2021-10-04 ENCOUNTER — Telehealth (HOSPITAL_COMMUNITY): Payer: Self-pay

## 2021-10-04 NOTE — Telephone Encounter (Signed)
Called regarding no show for today's OT appointment. Patient apologized. He has not been feeling well and forgot to call and cancel. He requested to cancel his next appointment for Friday as well. He was reminded of Monday's appointment next week and requested that he call the clinic if he is unable to attend. Patient verbalized understanding. ? ?Ailene Ravel, OTR/L,CBIS  ?[phone] ? ?

## 2021-10-06 ENCOUNTER — Ambulatory Visit (HOSPITAL_COMMUNITY): Payer: No Typology Code available for payment source | Admitting: Occupational Therapy

## 2021-10-06 ENCOUNTER — Telehealth (HOSPITAL_COMMUNITY): Payer: Self-pay | Admitting: Occupational Therapy

## 2021-10-06 NOTE — Telephone Encounter (Signed)
l/m on Firday with daughter to cx this apptment 5/15 @ 1pm Sam will not be available at this time-rquested pt to call back to r/s ?

## 2021-10-09 ENCOUNTER — Ambulatory Visit (HOSPITAL_COMMUNITY): Payer: No Typology Code available for payment source | Admitting: Occupational Therapy

## 2021-10-11 ENCOUNTER — Ambulatory Visit (HOSPITAL_COMMUNITY): Payer: No Typology Code available for payment source | Attending: Family Medicine | Admitting: Occupational Therapy

## 2021-10-11 ENCOUNTER — Encounter (HOSPITAL_COMMUNITY): Payer: Self-pay | Admitting: Occupational Therapy

## 2021-10-11 DIAGNOSIS — M25612 Stiffness of left shoulder, not elsewhere classified: Secondary | ICD-10-CM | POA: Diagnosis present

## 2021-10-11 DIAGNOSIS — M25512 Pain in left shoulder: Secondary | ICD-10-CM | POA: Diagnosis present

## 2021-10-11 DIAGNOSIS — R29898 Other symptoms and signs involving the musculoskeletal system: Secondary | ICD-10-CM | POA: Diagnosis present

## 2021-10-11 DIAGNOSIS — G8929 Other chronic pain: Secondary | ICD-10-CM | POA: Diagnosis present

## 2021-10-11 NOTE — Therapy (Addendum)
?OUTPATIENT OCCUPATIONAL THERAPY ORTHO TREATMENT ? ?Patient Name: [name] [name] ?MRN: [id number] ?DOB:18-Apr-1951, 71 y.o., male ?Today's Date: 10/11/2021 ? ?PCP: Pablo Lawrence, NP ?REFERRING PROVIDER: Meltontate, Gillis Santa, MD ? ? OT End of Session - 10/11/21 1211   ? ? Visit Number 2   ? Number of Visits 12   ? Date for OT Re-Evaluation 10/17/21   ? Authorization Type [employer]   ? Authorization Time Period [facility] approved 15 vists (09/05/21-01/03/22   ? Authorization - Visit Number 2   ? Authorization - Number of Visits 15   ? Progress Note Due on Visit 10   ? OT Start Time 1118   ? OT Stop Time 1200   ? OT Time Calculation (min) 42 min   ? Activity Tolerance Patient tolerated treatment well   ? Behavior During Therapy [facility] for tasks assessed/performed   ? ?  ?  ? ?  ? ? ? ?Past Medical History:  ?Diagnosis Date  ? Anxiety   ? Arthritis   ? CAD (coronary artery disease) cath in 2011  ? CAD in native artery 05/18/2014  ? Cellulitis of toe, right july 2012  ? Chronic diastolic heart failure ([place]) 05/18/2014  ? Cirrhosis of liver ([place])   ? ? ETOH, fatty liver   ? Depression   ? Diabetes mellitus   ? Hyperlipidemia 05/18/2014  ? Hypertension   ? Nonischemic cardiomyopathy ([place]) 05/18/2014  ? ?Past Surgical History:  ?Procedure Laterality Date  ? ANTERIOR CERVICAL DECOMP/DISCECTOMY FUSION N/A 06/14/2017  ? Procedure: ANTERIOR CERVICAL DECOMPRESSION/DISCECTOMY FUSION CERVICAL 4- CERVICAL 5;  Surgeon: Earnie Larsson, MD;  Location: [place];  Service: Neurosurgery;  Laterality: N/A;  ANTERIOR CERVICAL DECOMPRESSION/DISCECTOMY FUSION CERVICAL 4- CERVICAL 5  ? CARDIAC CATHETERIZATION  05/24/2010  ? Carotid duplex  05/23/2010  ? CARPAL TUNNEL RELEASE    ? CERVICAL SPINE SURGERY    ? COLONOSCOPY  FEB 2010 ARS NUR [place]  ? NL TCS  ? COLONOSCOPY N/A 08/01/2015  ? Dr. Oneida Alar: small internal hemorrhoids, next TCS in 10 years   ? DOPPLER ECHOCARDIOGRAPHY  05/09/2010  ? EF 45-50%  ? ESOPHAGOGASTRODUODENOSCOPY  02/07/2011  ? Dr.  Oneida Alar: H.pylori gastritis, mild portal gastropathy, treatmetn with Amoxicillin/Biaxin   ? ESOPHAGOGASTRODUODENOSCOPY N/A 08/01/2015  ? Dr. Oneida Alar: Reflux esophagitis, mild gastritis, next EGD in 3 years   ? lower ext atrerial duplex  05/23/2010  ? NM MYOVIEW LTD  01/08/2008  ? EF 53%  Low risk scan  ? sleep study  01/01/2008  ? ?Patient Active Problem List  ? Diagnosis Date Noted  ? Cervical spinal stenosis 06/14/2017  ? Reflux esophagitis 03/22/2017  ? Anemia due to other cause 01/14/2016  ? Special screening for malignant neoplasms, colon   ? Cirrhosis of liver without ascites ([place]) 07/14/2015  ? Loose stools 07/14/2015  ? Chest tightness   ? CAD in native artery 05/18/2014  ? Hyperlipidemia 05/18/2014  ? Nonischemic cardiomyopathy ([place]) 05/18/2014  ? Precordial chest pain 05/18/2014  ? Chronic diastolic heart failure ([place]) 05/18/2014  ? Helicobacter pylori gastritis 02/14/2011  ? Cirrhosis ([place]) 01/10/2011  ? Diabetes mellitus 01/10/2011  ? Cellulitis of great toe of right foot 01/10/2011  ? ? ?ONSET DATE: ongoing for at least a year. ? ?REFERRING DIAG: chronic left shoulder pain ? ?THERAPY DIAG:  ?Other symptoms and signs involving the musculoskeletal system ? ?Chronic left shoulder pain ? ?Stiffness of left shoulder, not elsewhere classified ? ?SUBJECTIVE:  ? ?SUBJECTIVE STATEMENT: ?S: I can't sleep on  my left side.  ? ?PERTINENT HISTORY: Left Shoulder pain and decreased range of motion for 6 months per Dr. Ronnie Derby . ? ?PRECAUTIONS: None ? ?WEIGHT BEARING RESTRICTIONS No ? ?PAIN:  ?Are you having pain? Yes: NPRS scale: 1/10  ?Pain location: left shoulder ?Pain description: sore ?Aggravating factors: laying down trying to sleep, using it ?Relieving factors: not moving it ? ? ? ?PATIENT GOALS  Able to move arm better.  ? ?OBJECTIVE:  ? ?HAND DOMINANCE: Right ? ?ADLs: ?Overall ADLs: Difficulty with all reaching behind back, out to the side, behind his head, above head, lifting.  ? ? ?FUNCTIONAL OUTCOME  MEASURES: ?FOTO: 47.7 ? ?UE ROM    ? ?Active ROM ?IR/er adducted- seated Left ?10/11/2021  ?Shoulder flexion 77  ?Shoulder abduction 56  ?Shoulder internal rotation 90  ?Shoulder external rotation 20  ?(Blank rows = not tested) ? ?   ?Passive ROM ?IR/er adducted - supine Left ?09/05/2021  ?Shoulder flexion 92  ?Shoulder abduction 95  ?Shoulder internal rotation 90  ?Shoulder external rotation 20  ?(Blank rows = not tested) ? ?UE MMT:    ? ?MMT- seated. IR/er adducted Left ?10/11/2021  ?Shoulder flexion 3-/5  ?Shoulder abduction 3-/5  ?Shoulder internal rotation 3/5  ?Shoulder external rotation 3-/5  ?(Blank rows = not tested) ? ? ? ? ? ? ? ?PATIENT EDUCATION: ?Education details:educated on purpose of P/ROM ?Person educated: Patient ?Education method: Demonstration, verbal explanation  ?Education comprehension:verbalized understanding  ? ? ?HOME EXERCISE PROGRAM: ?Eval: table slides ? ? ? Treatment: ? - Myofascial realease and manual techniques to L anterior and poster shoulder to decrease pain and fascial restrictions and to improve joint ROM. Completed separately form therapeutic exercise ? -P/ROM: flexion, abduction, horizontal abduction, protraction, internal and external rotation x10 reps each. ? -theraball stretch flexion and abduction x10 with 2" hold.  ? ?GOALS: ? ? ?SHORT TERM GOALS: Target date: 11/01/2021 ? ?Pt will be educated and independent with HEP in order to facilitate his progress in therapy and begin to use his left arm for all daily tasks.  ?Baseline: ?Goal status: ONGOING ? ?2.  Pt will increase his LUE P/ROM to WNL in order to increase ability to completing dressing tasks with less difficulty.  ?Baseline:  ?Goal status: ONGOING ? ?3.  Patient will decrease his left UE fascial restrictions to moderate amount to increase his functional mobility needed to complete low level reaching tasks.  ?Baseline:  ?Goal status: ONGOING ? ?4.  Patient will report decreased pain at night when attempting to sleep  while utilizing arm positioning techniques.  ?Baseline:  ?Goal status: ONGOING ? ?LONG TERM GOALS: Target date: 11/22/2021 ? ?Pt will increase his LUE A/ROM to [facility] in order to complete reaching tasks above his head with less difficulty.  ?Baseline:  ?Goal status: ONGOING ? ?2.  Pt will increase his LUE strength to 4+/5 in order to return to lifting and managing items of moderate weight with both hands.  ?Baseline:  ?Goal status: ONGOING ? ?3.  Patient will report a decrease in pain level when using his left UE for daily tasks and when sleeping of approximately 2/10 or less.  ?Baseline:  ?Goal status: ONGOING ? ?4.  Pt will decrease his left UE fascial restrictions to min amount or less in order to facilitate his functional mobility needed to complete high level reaching tasks.  ?Baseline:  ?Goal status: ONGOING ? ? ?ASSESSMENT: ?  ?CLINICAL IMPRESSION: A:Pt reported that he has engaged a little with HEP. Educated  to complete more frequently. Tolerated myofacial release well with moderate fascial restriction followed by P/ROM. Pt noted to increase range with reps of P/ROM. FOTO completed with this pt. Session ended with theraball P/ROM with verbal cuing.  ? ? ?PLAN: ?OT FREQUENCY: 2x/week ? ?OT DURATION: 6 weeks ? ?PLANNED INTERVENTIONS: self care/ADL training, therapeutic exercise, therapeutic activity, neuromuscular re-education, manual therapy, passive range of motion, electrical stimulation, ultrasound, moist heat, cryotherapy, and patient/family education ? ? ?CONSULTED AND AGREED WITH PLAN OF CARE: Patient ? ?PLAN FOR NEXT SESSION: Continue  myofacial release, P/ROM leading to AAROM.  ? ? Larey Seat OT, MOT ? ?[phone] ? ?10/11/2021, 12:14 PM ?  ?

## 2021-10-16 ENCOUNTER — Encounter (HOSPITAL_COMMUNITY): Payer: No Typology Code available for payment source

## 2021-10-16 ENCOUNTER — Ambulatory Visit (HOSPITAL_COMMUNITY): Payer: No Typology Code available for payment source | Admitting: Occupational Therapy

## 2021-10-16 ENCOUNTER — Encounter (HOSPITAL_COMMUNITY): Payer: Self-pay | Admitting: Occupational Therapy

## 2021-10-16 DIAGNOSIS — R29898 Other symptoms and signs involving the musculoskeletal system: Secondary | ICD-10-CM | POA: Diagnosis not present

## 2021-10-16 DIAGNOSIS — G8929 Other chronic pain: Secondary | ICD-10-CM

## 2021-10-16 DIAGNOSIS — M25612 Stiffness of left shoulder, not elsewhere classified: Secondary | ICD-10-CM

## 2021-10-16 NOTE — Therapy (Signed)
OUTPATIENT OCCUPATIONAL THERAPY ORTHO TREATMENT  Patient Name: [name] [name] MRN: [id number] DOB:1950/11/22, 71 y.o., male Today's Date: 10/16/2021  PCP: Pablo Lawrence, NP REFERRING PROVIDER: Margart Sickles, MD   OT End of Session - 10/16/21 1350     Visit Number 3    Number of Visits 12    Date for OT Re-Evaluation 10/17/21    Authorization Type [employer]    Authorization Time Period [facility] approved 15 vists (09/05/21-01/03/22    Authorization - Visit Number 3    Authorization - Number of Visits 15    Progress Note Due on Visit 10    OT Start Time 1300    OT Stop Time 3559    OT Time Calculation (min) 43 min    Activity Tolerance Patient tolerated treatment well    Behavior During Therapy [facility] for tasks assessed/performed               Past Medical History:  Diagnosis Date   Anxiety    Arthritis    CAD (coronary artery disease) cath in 2011   CAD in native artery 05/18/2014   Cellulitis of toe, right july 2012   Chronic diastolic heart failure ([place]) 05/18/2014   Cirrhosis of liver ([place])    ? ETOH, fatty liver    Depression    Diabetes mellitus    Hyperlipidemia 05/18/2014   Hypertension    Nonischemic cardiomyopathy ([place]) 05/18/2014   Past Surgical History:  Procedure Laterality Date   ANTERIOR CERVICAL DECOMP/DISCECTOMY FUSION N/A 06/14/2017   Procedure: ANTERIOR CERVICAL DECOMPRESSION/DISCECTOMY FUSION CERVICAL 4- CERVICAL 5;  Surgeon: Earnie Larsson, MD;  Location: [place];  Service: Neurosurgery;  Laterality: N/A;  ANTERIOR CERVICAL DECOMPRESSION/DISCECTOMY FUSION CERVICAL 4- CERVICAL 5   CARDIAC CATHETERIZATION  05/24/2010   Carotid duplex  05/23/2010   CARPAL TUNNEL RELEASE     CERVICAL SPINE SURGERY     COLONOSCOPY  FEB 2010 ARS NUR [place]   NL TCS   COLONOSCOPY N/A 08/01/2015   Dr. Oneida Alar: small internal hemorrhoids, next TCS in 10 years    DOPPLER ECHOCARDIOGRAPHY  05/09/2010   EF 45-50%   ESOPHAGOGASTRODUODENOSCOPY  02/07/2011   Dr.  Oneida Alar: H.pylori gastritis, mild portal gastropathy, treatmetn with Amoxicillin/Biaxin    ESOPHAGOGASTRODUODENOSCOPY N/A 08/01/2015   Dr. Oneida Alar: Reflux esophagitis, mild gastritis, next EGD in 3 years    lower ext atrerial duplex  05/23/2010   NM MYOVIEW LTD  01/08/2008   EF 53%  Low risk scan   sleep study  01/01/2008   Patient Active Problem List   Diagnosis Date Noted   Cervical spinal stenosis 06/14/2017   Reflux esophagitis 03/22/2017   Anemia due to other cause 01/14/2016   Special screening for malignant neoplasms, colon    Cirrhosis of liver without ascites ([place]) 07/14/2015   Loose stools 07/14/2015   Chest tightness    CAD in native artery 05/18/2014   Hyperlipidemia 05/18/2014   Nonischemic cardiomyopathy ([place]) 05/18/2014   Precordial chest pain 05/18/2014   Chronic diastolic heart failure ([place]) 74/16/3845   Helicobacter pylori gastritis 02/14/2011   Cirrhosis ([place]) 01/10/2011   Diabetes mellitus 01/10/2011   Cellulitis of great toe of right foot 01/10/2011    ONSET DATE: ongoing for at least a year.  REFERRING DIAG: chronic left shoulder pain  THERAPY DIAG:  Other symptoms and signs involving the musculoskeletal system  Chronic left shoulder pain  Stiffness of left shoulder, not elsewhere classified  Rationale for Evaluation and Treatment Rehabilitation   SUBJECTIVE:  SUBJECTIVE STATEMENT: S: If I'm not moving it I don't have any pain.   PERTINENT HISTORY: Left Shoulder pain and decreased range of motion for 6 months per Dr. Ronnie Derby .  PRECAUTIONS: None  WEIGHT BEARING RESTRICTIONS No  PAIN:  Are you having pain? Yes: NPRS scale: 1/10  Pain location: left shoulder Pain description: sore Aggravating factors: laying down trying to sleep, using it Relieving factors: not moving it    PATIENT GOALS  Able to move arm better.   OBJECTIVE:   HAND DOMINANCE: Right  ADLs: Overall ADLs: Difficulty with all reaching behind back, out to the  side, behind his head, above head, lifting.    FUNCTIONAL OUTCOME MEASURES: FOTO: 47.7  UE ROM     Active ROM IR/er adducted- seated Left 10/16/2021  Shoulder flexion 77  Shoulder abduction 56  Shoulder internal rotation 90  Shoulder external rotation 20  (Blank rows = not tested)     Passive ROM IR/er adducted - supine Left 09/05/2021  Shoulder flexion 92  Shoulder abduction 95  Shoulder internal rotation 90  Shoulder external rotation 20  (Blank rows = not tested)  UE MMT:     MMT- seated. IR/er adducted Left 10/16/2021  Shoulder flexion 3-/5  Shoulder abduction 3-/5  Shoulder internal rotation 3/5  Shoulder external rotation 3-/5  (Blank rows = not tested)        PATIENT EDUCATION: Education details:educated on purpose of P/ROM Person educated: Patient Education method: Media planner, verbal explanation  Education comprehension:verbalized understanding    HOME EXERCISE PROGRAM: Eval: table slides    Treatment:  10/16/2021 - Myofascial realease and manual techniques to left anterior and poster shoulder to decrease pain and fascial restrictions and to improve joint ROM. Completed separately form therapeutic exercise  -P/ROM: supine: flexion, abduction, horizontal abduction, er/IR, 10X each  -AA/ROM : supine, protraction, flexion, horizontal abduction, er/IR, abduction, 10X each  -AA/ROM : standing, protraction, flexion, horizontal abduction, er/IR, abduction, 10X each   -Wall Wash: 1' flexion  -Pulleys: 1' flexion, 1' abduction   10/11/2021 - Myofascial realease and manual techniques to L anterior and poster shoulder to decrease pain and fascial restrictions and to improve joint ROM. Completed separately form therapeutic exercise  -P/ROM: flexion, abduction, horizontal abduction, protraction, internal and external rotation x10 reps each.  -theraball stretch flexion and abduction x10 with 2" hold.   GOALS:   SHORT TERM GOALS: Target date:  11/06/2021  Pt will be educated and independent with HEP in order to facilitate his progress in therapy and begin to use his left arm for all daily tasks.  Baseline: Goal status: ONGOING  2.  Pt will increase his LUE P/ROM to WNL in order to increase ability to completing dressing tasks with less difficulty.  Baseline:  Goal status: ONGOING  3.  Patient will decrease his left UE fascial restrictions to moderate amount to increase his functional mobility needed to complete low level reaching tasks.  Baseline:  Goal status: ONGOING  4.  Patient will report decreased pain at night when attempting to sleep while utilizing arm positioning techniques.  Baseline:  Goal status: ONGOING  LONG TERM GOALS: Target date: 11/27/2021  Pt will increase his LUE A/ROM to [facility] in order to complete reaching tasks above his head with less difficulty.  Baseline:  Goal status: ONGOING  2.  Pt will increase his LUE strength to 4+/5 in order to return to lifting and managing items of moderate weight with both hands.  Baseline:  Goal status:  ONGOING  3.  Patient will report a decrease in pain level when using his left UE for daily tasks and when sleeping of approximately 2/10 or less.  Baseline:  Goal status: ONGOING  4.  Pt will decrease his left UE fascial restrictions to min amount or less in order to facilitate his functional mobility needed to complete high level reaching tasks.  Baseline:  Goal status: ONGOING   ASSESSMENT:   CLINICAL IMPRESSION: A: Pt reports he has not completed his HEP because his arm hurts when he moves it.  Discussed expectations of pain and soreness during HEP and when using his arm, also reviewed when to stop HEP or use for sharp or shooting pains. Pt verbalized understanding. Continued with manual therapy, passive stretching, and added AA/ROM in supine.   PLAN: OT FREQUENCY: 2x/week  OT DURATION: 6 weeks  PLANNED INTERVENTIONS: self care/ADL training, therapeutic  exercise, therapeutic activity, neuromuscular re-education, manual therapy, passive range of motion, electrical stimulation, ultrasound, moist heat, cryotherapy, and patient/family education   CONSULTED AND AGREED WITH PLAN OF CARE: Patient  PLAN FOR NEXT SESSION: Reassess and recert. Add red scapular theraband and follow up on HEP completion, continue with AA/ROM working on improved ROM and independence in form. Update HEP for AA/ROM   Guadelupe Sabin, OTR/L  [phone] 10/16/2021

## 2021-10-18 ENCOUNTER — Encounter: Payer: Self-pay | Admitting: Internal Medicine

## 2021-10-18 ENCOUNTER — Ambulatory Visit: Payer: No Typology Code available for payment source | Admitting: Internal Medicine

## 2021-10-18 ENCOUNTER — Ambulatory Visit (HOSPITAL_COMMUNITY): Payer: No Typology Code available for payment source | Admitting: Occupational Therapy

## 2021-10-26 ENCOUNTER — Ambulatory Visit (HOSPITAL_COMMUNITY): Payer: No Typology Code available for payment source | Attending: Family Medicine | Admitting: Occupational Therapy

## 2021-10-26 ENCOUNTER — Encounter (HOSPITAL_COMMUNITY): Payer: Self-pay | Admitting: Occupational Therapy

## 2021-10-26 DIAGNOSIS — G8929 Other chronic pain: Secondary | ICD-10-CM | POA: Insufficient documentation

## 2021-10-26 DIAGNOSIS — M25512 Pain in left shoulder: Secondary | ICD-10-CM | POA: Insufficient documentation

## 2021-10-26 DIAGNOSIS — M25612 Stiffness of left shoulder, not elsewhere classified: Secondary | ICD-10-CM | POA: Diagnosis present

## 2021-10-26 DIAGNOSIS — R29898 Other symptoms and signs involving the musculoskeletal system: Secondary | ICD-10-CM | POA: Diagnosis present

## 2021-10-26 NOTE — Patient Instructions (Signed)

## 2021-10-26 NOTE — Therapy (Signed)
OUTPATIENT OCCUPATIONAL THERAPY ORTHO TREATMENT  Patient Name: [name] BALEY MRN: [id number] DOB:1950-06-13, 71 y.o., male Today's Date: 10/26/2021  PCP: Pablo Lawrence, NP REFERRING PROVIDER: Margart Sickles, MD   OT End of Session - 10/26/21 1025     Visit Number 4    Number of Visits 12    Date for OT Re-Evaluation 11/25/21    Authorization Type [employer]    Authorization Time Period [facility] approved 15 vists (09/05/21-01/03/22    Authorization - Visit Number 4    Authorization - Number of Visits 15    Progress Note Due on Visit 10    OT Start Time 0945    OT Stop Time 1025    OT Time Calculation (min) 40 min    Activity Tolerance Patient tolerated treatment well    Behavior During Therapy [facility] for tasks assessed/performed                Past Medical History:  Diagnosis Date   Anxiety    Arthritis    CAD (coronary artery disease) cath in 2011   CAD in native artery 05/18/2014   Cellulitis of toe, right july 2012   Chronic diastolic heart failure ([place]) 05/18/2014   Cirrhosis of liver ([place])    ? ETOH, fatty liver    Depression    Diabetes mellitus    Hyperlipidemia 05/18/2014   Hypertension    Nonischemic cardiomyopathy ([place]) 05/18/2014   Past Surgical History:  Procedure Laterality Date   ANTERIOR CERVICAL DECOMP/DISCECTOMY FUSION N/A 06/14/2017   Procedure: ANTERIOR CERVICAL DECOMPRESSION/DISCECTOMY FUSION CERVICAL 4- CERVICAL 5;  Surgeon: Earnie Larsson, MD;  Location: [place];  Service: Neurosurgery;  Laterality: N/A;  ANTERIOR CERVICAL DECOMPRESSION/DISCECTOMY FUSION CERVICAL 4- CERVICAL 5   CARDIAC CATHETERIZATION  05/24/2010   Carotid duplex  05/23/2010   CARPAL TUNNEL RELEASE     CERVICAL SPINE SURGERY     COLONOSCOPY  FEB 2010 ARS NUR [place]   NL TCS   COLONOSCOPY N/A 08/01/2015   Dr. Oneida Alar: small internal hemorrhoids, next TCS in 10 years    DOPPLER ECHOCARDIOGRAPHY  05/09/2010   EF 45-50%   ESOPHAGOGASTRODUODENOSCOPY  02/07/2011   Dr.  Oneida Alar: H.pylori gastritis, mild portal gastropathy, treatmetn with Amoxicillin/Biaxin    ESOPHAGOGASTRODUODENOSCOPY N/A 08/01/2015   Dr. Oneida Alar: Reflux esophagitis, mild gastritis, next EGD in 3 years    lower ext atrerial duplex  05/23/2010   NM MYOVIEW LTD  01/08/2008   EF 53%  Low risk scan   sleep study  01/01/2008   Patient Active Problem List   Diagnosis Date Noted   Cervical spinal stenosis 06/14/2017   Reflux esophagitis 03/22/2017   Anemia due to other cause 01/14/2016   Special screening for malignant neoplasms, colon    Cirrhosis of liver without ascites ([place]) 07/14/2015   Loose stools 07/14/2015   Chest tightness    CAD in native artery 05/18/2014   Hyperlipidemia 05/18/2014   Nonischemic cardiomyopathy ([place]) 05/18/2014   Precordial chest pain 05/18/2014   Chronic diastolic heart failure ([place]) 38/32/9191   Helicobacter pylori gastritis 02/14/2011   Cirrhosis ([place]) 01/10/2011   Diabetes mellitus 01/10/2011   Cellulitis of great toe of right foot 01/10/2011    ONSET DATE: ongoing for at least a year.  REFERRING DIAG: chronic left shoulder pain  THERAPY DIAG:  Other symptoms and signs involving the musculoskeletal system  Chronic left shoulder pain  Stiffness of left shoulder, not elsewhere classified  Rationale for Evaluation and Treatment Rehabilitation  SUBJECTIVE:   SUBJECTIVE STATEMENT: S: It's sore.   PERTINENT HISTORY: Left Shoulder pain and decreased range of motion for 6 months per Dr. Ronnie Derby .  PRECAUTIONS: None  WEIGHT BEARING RESTRICTIONS No  PAIN:  Are you having pain? Yes: NPRS scale: 1/10  Pain location: left shoulder Pain description: sore Aggravating factors: moving it Relieving factors: not moving it    PATIENT GOALS  Able to move arm better.   OBJECTIVE:   HAND DOMINANCE: Right  ADLs: Overall ADLs: Difficulty with all reaching behind back, out to the side, behind his head, above head, lifting.    FUNCTIONAL  OUTCOME MEASURES: FOTO: 47.7  UE ROM     Active ROM IR/er adducted- seated Left 09/05/2021 Left 10/26/2021  Shoulder flexion 77 99  Shoulder abduction 56 66  Shoulder internal rotation 90 90  Shoulder external rotation 20 10  (Blank rows = not tested)     Passive ROM IR/er adducted - supine Left 09/05/2021 Left 10/26/2021  Shoulder flexion 92 138  Shoulder abduction 95 126  Shoulder internal rotation 90 90  Shoulder external rotation 20 20  (Blank rows = not tested)  UE MMT:     MMT- seated. IR/er adducted Left 09/05/2021 Left 10/26/2021  Shoulder flexion 3-/5 3-/5  Shoulder abduction 3-/5 3-/5  Shoulder internal rotation 3/5 4-/5  Shoulder external rotation 3-/5 3-/5  (Blank rows = not tested)        PATIENT EDUCATION: Education details: AA/ROM Person educated: Patient Education method: Media planner, verbal explanation  Education comprehension:verbalized understanding    HOME EXERCISE PROGRAM: Eval: table slides; 6/1: AA/ROM    Treatment:  10/26/2021 - Myofascial realease and manual techniques to left anterior and poster shoulder to decrease pain and fascial restrictions and to improve joint ROM. Completed separately form therapeutic exercise  -P/ROM: supine: flexion, abduction, horizontal abduction, er/IR, 5X each  -AA/ROM: standing, protraction, flexion, horizontal abduction, er/IR, abduction, 10X each  -Wall wash: 1' flexion   -Pulleys: 1' flexion, 1' abduction  10/16/2021 - Myofascial realease and manual techniques to left anterior and poster shoulder to decrease pain and fascial restrictions and to improve joint ROM. Completed separately form therapeutic exercise  -P/ROM: supine: flexion, abduction, horizontal abduction, er/IR, 10X each  -AA/ROM : supine, protraction, flexion, horizontal abduction, er/IR, abduction, 10X each  -AA/ROM : standing, protraction, flexion, horizontal abduction, er/IR, abduction, 10X each   -Wall Wash: 1' flexion  -Pulleys: 1'  flexion, 1' abduction   10/11/2021 - Myofascial realease and manual techniques to L anterior and poster shoulder to decrease pain and fascial restrictions and to improve joint ROM. Completed separately form therapeutic exercise  -P/ROM: flexion, abduction, horizontal abduction, protraction, internal and external rotation x10 reps each.  -theraball stretch flexion and abduction x10 with 2" hold.   GOALS:   SHORT TERM GOALS: Target date: 11/16/2021  Pt will be educated and independent with HEP in order to facilitate his progress in therapy and begin to use his left arm for all daily tasks.  Baseline: Goal status: ONGOING  2.  Pt will increase his LUE P/ROM to WNL in order to increase ability to completing dressing tasks with less difficulty.  Baseline:  Goal status: ONGOING  3.  Patient will decrease his left UE fascial restrictions to moderate amount to increase his functional mobility needed to complete low level reaching tasks.  Baseline:  Goal status: ONGOING  4.  Patient will report decreased pain at night when attempting to sleep while utilizing arm positioning techniques.  Baseline:  Goal status: ONGOING  LONG TERM GOALS: Target date: 12/07/2021  Pt will increase his LUE A/ROM to [facility] in order to complete reaching tasks above his head with less difficulty.  Baseline:  Goal status: ONGOING  2.  Pt will increase his LUE strength to 4+/5 in order to return to lifting and managing items of moderate weight with both hands.  Baseline:  Goal status: ONGOING  3.  Patient will report a decrease in pain level when using his left UE for daily tasks and when sleeping of approximately 2/10 or less.  Baseline:  Goal status: ONGOING  4.  Pt will decrease his left UE fascial restrictions to min amount or less in order to facilitate his functional mobility needed to complete high level reaching tasks.  Baseline:  Goal status: ONGOING   ASSESSMENT:   CLINICAL IMPRESSION: A: Pt reports  he has not completed his HEP because his arm hurts when he moves it and he lost the paper. Discussed expectations of pain and soreness during HEP and when using his arm, and importance of completing HEP. Reassessment completed this date, pt has made some improvements in ROM, however has not met any goals thus far and continues to have significant pain, decreased ROM, and decreased strength. Pt has only attended evaluation and 2 treatment sessions, is not completing HEP. Would like to follow up with MD before deciding if he is going to continue with therapy or not. HEP updated for AA/ROM, verbal cuing for form and technique during session.   PLAN: OT FREQUENCY: 2x/week  OT DURATION: 6 weeks  PLANNED INTERVENTIONS: self care/ADL training, therapeutic exercise, therapeutic activity, neuromuscular re-education, manual therapy, passive range of motion, electrical stimulation, ultrasound, moist heat, cryotherapy, and patient/family education   CONSULTED AND AGREED WITH PLAN OF CARE: Patient  PLAN FOR NEXT SESSION: Hold until after MD appt     Guadelupe Sabin, OTR/L  [phone] 10/16/2021

## 2021-10-30 DIAGNOSIS — E1165 Type 2 diabetes mellitus with hyperglycemia: Secondary | ICD-10-CM | POA: Diagnosis not present

## 2021-10-30 DIAGNOSIS — Z0000 Encounter for general adult medical examination without abnormal findings: Secondary | ICD-10-CM | POA: Diagnosis not present

## 2021-10-30 DIAGNOSIS — Z794 Long term (current) use of insulin: Secondary | ICD-10-CM | POA: Diagnosis not present

## 2021-10-30 DIAGNOSIS — K2100 Gastro-esophageal reflux disease with esophagitis, without bleeding: Secondary | ICD-10-CM | POA: Diagnosis not present

## 2021-10-30 DIAGNOSIS — Z1211 Encounter for screening for malignant neoplasm of colon: Secondary | ICD-10-CM | POA: Diagnosis not present

## 2021-10-30 DIAGNOSIS — Z91148 Patient's other noncompliance with medication regimen for other reason: Secondary | ICD-10-CM | POA: Diagnosis not present

## 2021-10-30 DIAGNOSIS — Z23 Encounter for immunization: Secondary | ICD-10-CM | POA: Diagnosis not present

## 2021-11-17 DIAGNOSIS — Z794 Long term (current) use of insulin: Secondary | ICD-10-CM | POA: Diagnosis not present

## 2021-11-17 DIAGNOSIS — I700 Atherosclerosis of aorta: Secondary | ICD-10-CM | POA: Diagnosis not present

## 2021-11-17 DIAGNOSIS — Z7984 Long term (current) use of oral hypoglycemic drugs: Secondary | ICD-10-CM | POA: Diagnosis not present

## 2021-11-17 DIAGNOSIS — F3342 Major depressive disorder, recurrent, in full remission: Secondary | ICD-10-CM | POA: Diagnosis not present

## 2021-11-17 DIAGNOSIS — I10 Essential (primary) hypertension: Secondary | ICD-10-CM | POA: Diagnosis not present

## 2021-11-17 DIAGNOSIS — K7460 Unspecified cirrhosis of liver: Secondary | ICD-10-CM | POA: Diagnosis not present

## 2021-11-17 DIAGNOSIS — E1165 Type 2 diabetes mellitus with hyperglycemia: Secondary | ICD-10-CM | POA: Diagnosis not present

## 2021-11-17 DIAGNOSIS — Z7982 Long term (current) use of aspirin: Secondary | ICD-10-CM | POA: Diagnosis not present

## 2021-11-17 DIAGNOSIS — Z6832 Body mass index (BMI) 32.0-32.9, adult: Secondary | ICD-10-CM | POA: Diagnosis not present

## 2021-12-06 ENCOUNTER — Encounter (INDEPENDENT_AMBULATORY_CARE_PROVIDER_SITE_OTHER): Payer: Self-pay

## 2021-12-06 ENCOUNTER — Ambulatory Visit (INDEPENDENT_AMBULATORY_CARE_PROVIDER_SITE_OTHER): Payer: PPO | Admitting: Internal Medicine

## 2021-12-06 ENCOUNTER — Other Ambulatory Visit (INDEPENDENT_AMBULATORY_CARE_PROVIDER_SITE_OTHER): Payer: Self-pay

## 2021-12-06 ENCOUNTER — Telehealth (INDEPENDENT_AMBULATORY_CARE_PROVIDER_SITE_OTHER): Payer: Self-pay

## 2021-12-06 ENCOUNTER — Encounter: Payer: Self-pay | Admitting: Internal Medicine

## 2021-12-06 VITALS — BP 139/83 | HR 88 | Temp 98.3°F | Ht 66.0 in | Wt 205.5 lb

## 2021-12-06 DIAGNOSIS — K219 Gastro-esophageal reflux disease without esophagitis: Secondary | ICD-10-CM

## 2021-12-06 DIAGNOSIS — R195 Other fecal abnormalities: Secondary | ICD-10-CM

## 2021-12-06 DIAGNOSIS — K7030 Alcoholic cirrhosis of liver without ascites: Secondary | ICD-10-CM

## 2021-12-06 MED ORDER — PEG 3350-KCL-NA BICARB-NACL 420 G PO SOLR: 4000.0000 mL | ORAL | 0 refills | Status: DC

## 2021-12-06 NOTE — Telephone Encounter (Signed)
[name] [name], CMA  ?

## 2021-12-06 NOTE — Patient Instructions (Addendum)
We will schedule you for upper endoscopy to screen for esophageal varices.  At the same time we will perform colonoscopy given your recently positive Cologuard testing.  You will need to follow-up with [place] every 6 months in regards to your liver.  I am going to check blood work in regards to your liver today at [facility]  Follow-up after procedures.  It was very nice meeting both you today.  Dr. Abbey Chatters

## 2021-12-06 NOTE — Progress Notes (Signed)
Primary Care Physician:  Pablo Lawrence, NP Primary Gastroenterologist:  Dr. Abbey Chatters  Chief Complaint  Patient presents with   Cirrhosis    New patient.Arrives with daughter [name] [name].  Referred for cirrhosis and RUQ pain.     HPI:   [name] [name] is a 71 y.o. male who presents to clinic today by referral from his PCP Pablo Lawrence for evaluation.  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  No history of decompensating event.  Last seen by GI 2018.  Last EGD 08/01/2015 without varices.  Recommended 3-year recall.  Colonoscopy at the same time unremarkable.  Patient received underwent Cologuard testing which was positive.  No history of hepatic encephalopathy.  No history of hypervolemia.  CT abdomen pelvis with contrast on 09/18/2021 without evidence of hepatoma.  Cirrhotic morphology.  No evidence of portal hypertension.  Chronic GERD well-controlled on pantoprazole.  Denies any epigastric or chest pain.  No dysphagia odynophagia.  Today states he is doing overall.  Does note some chronic right upper quadrant pain.  Intermittent, mild to moderate in nature.  Does not radiate.  Past Medical History:  Diagnosis Date   Anxiety    Arthritis    CAD (coronary artery disease) cath in 2011   CAD in native artery 05/18/2014   Cellulitis of toe, right july 2012   Chronic diastolic heart failure ([place]) 05/18/2014   Cirrhosis of liver ([place])    ? ETOH, fatty liver    Depression    Diabetes mellitus    Hyperlipidemia 05/18/2014   Hypertension    Nonischemic cardiomyopathy ([place]) 05/18/2014    Past Surgical History:  Procedure Laterality Date   ANTERIOR CERVICAL DECOMP/DISCECTOMY FUSION N/A 06/14/2017   Procedure: ANTERIOR CERVICAL DECOMPRESSION/DISCECTOMY FUSION CERVICAL 4- CERVICAL 5;  Surgeon: Earnie Larsson, MD;  Location: [place];  Service: Neurosurgery;  Laterality: N/A;  ANTERIOR CERVICAL DECOMPRESSION/DISCECTOMY FUSION CERVICAL 4- CERVICAL 5   CARDIAC CATHETERIZATION  05/24/2010    Carotid duplex  05/23/2010   CARPAL TUNNEL RELEASE     CERVICAL SPINE SURGERY     COLONOSCOPY  FEB 2010 ARS NUR [place]   NL TCS   COLONOSCOPY N/A 08/01/2015   Dr. Oneida Alar: small internal hemorrhoids, next TCS in 10 years    DOPPLER ECHOCARDIOGRAPHY  05/09/2010   EF 45-50%   ESOPHAGOGASTRODUODENOSCOPY  02/07/2011   Dr. Oneida Alar: H.pylori gastritis, mild portal gastropathy, treatmetn with Amoxicillin/Biaxin    ESOPHAGOGASTRODUODENOSCOPY N/A 08/01/2015   Dr. Oneida Alar: Reflux esophagitis, mild gastritis, next EGD in 3 years    lower ext atrerial duplex  05/23/2010   NM MYOVIEW LTD  01/08/2008   EF 53%  Low risk scan   sleep study  01/01/2008    Current Outpatient Medications  Medication Sig Dispense Refill   aspirin 81 MG tablet Take 81 mg by mouth daily.       carvedilol (COREG) 25 MG tablet Take 12.5 mg by mouth 2 (two) times daily with a meal.     cyclobenzaprine (FLEXERIL) 10 MG tablet Take 1 tablet (10 mg total) by mouth 3 (three) times daily as needed for muscle spasms. 30 tablet 5   desvenlafaxine (PRISTIQ) 100 MG 24 hr tablet Take 100 mg by mouth daily.     empagliflozin (JARDIANCE) 10 MG TABS tablet Take 10 mg by mouth daily.     fish oil-omega-3 fatty acids 1000 MG capsule Take 1 g by mouth daily.      gabapentin (NEURONTIN) 600 MG tablet Take 600 mg by mouth  2 (two) times daily.      glyBURIDE (DIABETA) 5 MG tablet Take 5 mg by mouth at bedtime.     insulin aspart (NOVOLOG) 100 UNIT/ML injection Inject 10 Units into the skin 3 (three) times daily before meals.     insulin glargine (LANTUS) 100 UNIT/ML injection Inject 50 Units into the skin 2 (two) times daily.      methocarbamol (ROBAXIN) 500 MG tablet Take 1 tablet (500 mg total) by mouth 2 (two) times daily. 20 tablet 0   naproxen sodium (ALEVE) 220 MG tablet Take 220 mg by mouth daily as needed (for pain or headache).     pantoprazole (PROTONIX) 40 MG tablet Take 1 tablet (40 mg total) by mouth 2 (two) times daily before a meal. 180  tablet 3   rosuvastatin (CRESTOR) 10 MG tablet TAKE ONE (1) TABLET EACH DAY 90 tablet 3   sildenafil (VIAGRA) 100 MG tablet Take 100 mg by mouth daily as needed for erectile dysfunction.     fluocinonide cream (LIDEX) 2.11 % Apply 1 application topically daily.  (Patient not taking: Reported on 12/06/2021)     No current facility-administered medications for this visit.    Allergies as of 12/06/2021 - Review Complete 12/06/2021  Allergen Reaction Noted   Glipizide Rash 11/04/2019   Naprosyn [naproxen] Hives 11/30/2010   Niacin Rash 10/04/2009    Family History  Problem Relation Age of Onset   Cirrhosis Mother    Heart attack Father    Colon cancer Neg Hx    Liver disease Neg Hx     Social History   Socioeconomic History   Marital status: Single    Spouse name: Not on file   Number of children: 5   Years of education: 9   Highest education level: GED or equivalent  Occupational History   Occupation: Chemical engineer    Comment: Exposure to gold, tin, nickle  Tobacco Use   Smoking status: Never    Passive exposure: Current   Smokeless tobacco: Never  Vaping Use   Vaping Use: Never used  Substance and Sexual Activity   Alcohol use: Yes    Alcohol/week: 3.0 standard drinks of alcohol    Types: 3 Cans of beer per week    Comment: very rare alcohol use-a beer maybe every 6 months    Drug use: Yes    Types: Marijuana    Comment: as a young teen   Sexual activity: Not on file  Other Topics Concern   Not on file  Social History Narrative   Lives with daughter in a one story home.  Has 5 children.  On disability.  Education: GED.    Social Determinants of Health   Financial Resource Strain: Not on file  Food Insecurity: Not on file  Transportation Needs: Not on file  Physical Activity: Not on file  Stress: Not on file  Social Connections: Not on file  Intimate Partner Violence: Not on file    Subjective: Review of Systems  Constitutional:  Negative for chills  and fever.  HENT:  Negative for congestion and hearing loss.   Eyes:  Negative for blurred vision and double vision.  Respiratory:  Negative for cough and shortness of breath.   Cardiovascular:  Negative for chest pain and palpitations.  Gastrointestinal:  Positive for heartburn. Negative for abdominal pain, blood in stool, constipation, diarrhea, melena and vomiting.  Genitourinary:  Negative for dysuria and urgency.  Musculoskeletal:  Negative for joint pain and myalgias.  Skin:  Negative for itching and rash.  Neurological:  Negative for dizziness and headaches.  Psychiatric/Behavioral:  Negative for depression. The patient is not nervous/anxious.        Objective: BP 139/83 (BP Location: Left Arm, Patient Position: Sitting, Cuff Size: Large)   Pulse 88   Temp 98.3 F (36.8 C) (Oral)   Ht '5\' 6"'$  (1.676 m)   Wt 205 lb 8 oz (93.2 kg)   BMI 33.17 kg/m  Physical Exam Constitutional:      Appearance: Normal appearance.  HENT:     Head: Normocephalic and atraumatic.  Eyes:     Extraocular Movements: Extraocular movements intact.     Conjunctiva/sclera: Conjunctivae normal.  Cardiovascular:     Rate and Rhythm: Normal rate and regular rhythm.  Pulmonary:     Effort: Pulmonary effort is normal.     Breath sounds: Normal breath sounds.  Abdominal:     General: Bowel sounds are normal.     Palpations: Abdomen is soft.  Musculoskeletal:        General: Normal range of motion.     Cervical back: Normal range of motion and neck supple.  Skin:    General: Skin is warm.  Neurological:     General: No focal deficit present.     Mental Status: He is alert and oriented to person, place, and time.  Psychiatric:        Mood and Affect: Mood normal.        Behavior: Behavior normal.      Assessment: *Cirrhosis-etiology alcohol/NASH *Chronic GERD-well-controlled on pantoprazole *Positive Cologuard testing  Plan: Discussed cirrhosis in depth with patient and his daughter  today.  Ultrasound for hepatoma screening due October 2023.  No history of encephalopathy.  Continue to monitor.  No history of hypervolemia, continue to monitor.  We will schedule for EGD today for variceal screening.  At the same time we will perform colonoscopy for positive Cologuard testing.  The risks including infection, bleed, or perforation as well as benefits, limitations, alternatives and imponderables have been reviewed with the patient. Questions have been answered. All parties agreeable.  I will order MELD labs today.  Follow-up in 6 months.  Thank you Pablo Lawrence for the kind referral   12/06/2021 3:43 PM   Disclaimer: This note was dictated with voice recognition software. Similar sounding words can inadvertently be transcribed and may not be corrected upon review.

## 2021-12-06 NOTE — H&P (View-Only) (Signed)
Primary Care Physician:  Pablo Lawrence, NP Primary Gastroenterologist:  Dr. Abbey Chatters  Chief Complaint  Patient presents with   Cirrhosis    New patient.Arrives with daughter [name].  Referred for cirrhosis and RUQ pain.     HPI:   [name] [name] is a 71 y.o. male who presents to clinic today by referral from his PCP Pablo Lawrence for evaluation.  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  No history of decompensating event.  Last seen by GI 2018.  Last EGD 08/01/2015 without varices.  Recommended 3-year recall.  Colonoscopy at the same time unremarkable.  Patient received underwent Cologuard testing which was positive.  No history of hepatic encephalopathy.  No history of hypervolemia.  CT abdomen pelvis with contrast on 09/18/2021 without evidence of hepatoma.  Cirrhotic morphology.  No evidence of portal hypertension.  Chronic GERD well-controlled on pantoprazole.  Denies any epigastric or chest pain.  No dysphagia odynophagia.  Today states he is doing overall.  Does note some chronic right upper quadrant pain.  Intermittent, mild to moderate in nature.  Does not radiate.  Past Medical History:  Diagnosis Date   Anxiety    Arthritis    CAD (coronary artery disease) cath in 2011   CAD in native artery 05/18/2014   Cellulitis of toe, right july 2012   Chronic diastolic heart failure ([place]) 05/18/2014   Cirrhosis of liver ([place])    ? ETOH, fatty liver    Depression    Diabetes mellitus    Hyperlipidemia 05/18/2014   Hypertension    Nonischemic cardiomyopathy ([place]) 05/18/2014    Past Surgical History:  Procedure Laterality Date   ANTERIOR CERVICAL DECOMP/DISCECTOMY FUSION N/A 06/14/2017   Procedure: ANTERIOR CERVICAL DECOMPRESSION/DISCECTOMY FUSION CERVICAL 4- CERVICAL 5;  Surgeon: Earnie Larsson, MD;  Location: [facility];  Service: Neurosurgery;  Laterality: N/A;  ANTERIOR CERVICAL DECOMPRESSION/DISCECTOMY FUSION CERVICAL 4- CERVICAL 5   CARDIAC CATHETERIZATION  05/24/2010    Carotid duplex  05/23/2010   CARPAL TUNNEL RELEASE     CERVICAL SPINE SURGERY     COLONOSCOPY  FEB 2010 ARS NUR [facility]   NL TCS   COLONOSCOPY N/A 08/01/2015   Dr. Oneida Alar: small internal hemorrhoids, next TCS in 10 years    DOPPLER ECHOCARDIOGRAPHY  05/09/2010   EF 45-50%   ESOPHAGOGASTRODUODENOSCOPY  02/07/2011   Dr. Oneida Alar: H.pylori gastritis, mild portal gastropathy, treatmetn with Amoxicillin/Biaxin    ESOPHAGOGASTRODUODENOSCOPY N/A 08/01/2015   Dr. Oneida Alar: Reflux esophagitis, mild gastritis, next EGD in 3 years    lower ext atrerial duplex  05/23/2010   NM MYOVIEW LTD  01/08/2008   EF 53%  Low risk scan   sleep study  01/01/2008    Current Outpatient Medications  Medication Sig Dispense Refill   aspirin 81 MG tablet Take 81 mg by mouth daily.       carvedilol (COREG) 25 MG tablet Take 12.5 mg by mouth 2 (two) times daily with a meal.     cyclobenzaprine (FLEXERIL) 10 MG tablet Take 1 tablet (10 mg total) by mouth 3 (three) times daily as needed for muscle spasms. 30 tablet 5   desvenlafaxine (PRISTIQ) 100 MG 24 hr tablet Take 100 mg by mouth daily.     empagliflozin (JARDIANCE) 10 MG TABS tablet Take 10 mg by mouth daily.     fish oil-omega-3 fatty acids 1000 MG capsule Take 1 g by mouth daily.      gabapentin (NEURONTIN) 600 MG tablet Take 600 mg by mouth  2 (two) times daily.      glyBURIDE (DIABETA) 5 MG tablet Take 5 mg by mouth at bedtime.     insulin aspart (NOVOLOG) 100 UNIT/ML injection Inject 10 Units into the skin 3 (three) times daily before meals.     insulin glargine (LANTUS) 100 UNIT/ML injection Inject 50 Units into the skin 2 (two) times daily.      methocarbamol (ROBAXIN) 500 MG tablet Take 1 tablet (500 mg total) by mouth 2 (two) times daily. 20 tablet 0   naproxen sodium (ALEVE) 220 MG tablet Take 220 mg by mouth daily as needed (for pain or headache).     pantoprazole (PROTONIX) 40 MG tablet Take 1 tablet (40 mg total) by mouth 2 (two) times daily before a meal. 180  tablet 3   rosuvastatin (CRESTOR) 10 MG tablet TAKE ONE (1) TABLET EACH DAY 90 tablet 3   sildenafil (VIAGRA) 100 MG tablet Take 100 mg by mouth daily as needed for erectile dysfunction.     fluocinonide cream (LIDEX) 4.53 % Apply 1 application topically daily.  (Patient not taking: Reported on 12/06/2021)     No current facility-administered medications for this visit.    Allergies as of 12/06/2021 - Review Complete 12/06/2021  Allergen Reaction Noted   Glipizide Rash 11/04/2019   Naprosyn [naproxen] Hives 11/30/2010   Niacin Rash 10/04/2009    Family History  Problem Relation Age of Onset   Cirrhosis Mother    Heart attack Father    Colon cancer Neg Hx    Liver disease Neg Hx     Social History   Socioeconomic History   Marital status: Single    Spouse name: Not on file   Number of children: 5   Years of education: 9   Highest education level: GED or equivalent  Occupational History   Occupation: Chemical engineer    Comment: Exposure to gold, tin, nickle  Tobacco Use   Smoking status: Never    Passive exposure: Current   Smokeless tobacco: Never  Vaping Use   Vaping Use: Never used  Substance and Sexual Activity   Alcohol use: Yes    Alcohol/week: 3.0 standard drinks of alcohol    Types: 3 Cans of beer per week    Comment: very rare alcohol use-a beer maybe every 6 months    Drug use: Yes    Types: Marijuana    Comment: as a young teen   Sexual activity: Not on file  Other Topics Concern   Not on file  Social History Narrative   Lives with daughter in a one story home.  Has 5 children.  On disability.  Education: GED.    Social Determinants of Health   Financial Resource Strain: Not on file  Food Insecurity: Not on file  Transportation Needs: Not on file  Physical Activity: Not on file  Stress: Not on file  Social Connections: Not on file  Intimate Partner Violence: Not on file    Subjective: Review of Systems  Constitutional:  Negative for chills  and fever.  HENT:  Negative for congestion and hearing loss.   Eyes:  Negative for blurred vision and double vision.  Respiratory:  Negative for cough and shortness of breath.   Cardiovascular:  Negative for chest pain and palpitations.  Gastrointestinal:  Positive for heartburn. Negative for abdominal pain, blood in stool, constipation, diarrhea, melena and vomiting.  Genitourinary:  Negative for dysuria and urgency.  Musculoskeletal:  Negative for joint pain and myalgias.  Skin:  Negative for itching and rash.  Neurological:  Negative for dizziness and headaches.  Psychiatric/Behavioral:  Negative for depression. The patient is not nervous/anxious.        Objective: BP 139/83 (BP Location: Left Arm, Patient Position: Sitting, Cuff Size: Large)   Pulse 88   Temp 98.3 F (36.8 C) (Oral)   Ht '5\' 6"'$  (1.676 m)   Wt 205 lb 8 oz (93.2 kg)   BMI 33.17 kg/m  Physical Exam Constitutional:      Appearance: Normal appearance.  HENT:     Head: Normocephalic and atraumatic.  Eyes:     Extraocular Movements: Extraocular movements intact.     Conjunctiva/sclera: Conjunctivae normal.  Cardiovascular:     Rate and Rhythm: Normal rate and regular rhythm.  Pulmonary:     Effort: Pulmonary effort is normal.     Breath sounds: Normal breath sounds.  Abdominal:     General: Bowel sounds are normal.     Palpations: Abdomen is soft.  Musculoskeletal:        General: Normal range of motion.     Cervical back: Normal range of motion and neck supple.  Skin:    General: Skin is warm.  Neurological:     General: No focal deficit present.     Mental Status: He is alert and oriented to person, place, and time.  Psychiatric:        Mood and Affect: Mood normal.        Behavior: Behavior normal.      Assessment: *Cirrhosis-etiology alcohol/NASH *Chronic GERD-well-controlled on pantoprazole *Positive Cologuard testing  Plan: Discussed cirrhosis in depth with patient and his daughter  today.  Ultrasound for hepatoma screening due October 2023.  No history of encephalopathy.  Continue to monitor.  No history of hypervolemia, continue to monitor.  We will schedule for EGD today for variceal screening.  At the same time we will perform colonoscopy for positive Cologuard testing.  The risks including infection, bleed, or perforation as well as benefits, limitations, alternatives and imponderables have been reviewed with the patient. Questions have been answered. All parties agreeable.  I will order MELD labs today.  Follow-up in 6 months.  Thank you Pablo Lawrence for the kind referral   12/06/2021 3:43 PM   Disclaimer: This note was dictated with voice recognition software. Similar sounding words can inadvertently be transcribed and may not be corrected upon review.

## 2021-12-07 ENCOUNTER — Encounter (INDEPENDENT_AMBULATORY_CARE_PROVIDER_SITE_OTHER): Payer: Self-pay

## 2021-12-07 LAB — COMPLETE METABOLIC PANEL WITH GFR
AG Ratio: 1.4 (calc) (ref 1.0–2.5)
ALT: 15 U/L (ref 9–46)
AST: 17 U/L (ref 10–35)
Albumin: 4.2 g/dL (ref 3.6–5.1)
Alkaline phosphatase (APISO): 89 U/L (ref 35–144)
BUN: 22 mg/dL (ref 7–25)
CO2: 27 mmol/L (ref 20–32)
Calcium: 10 mg/dL (ref 8.6–10.3)
Chloride: 103 mmol/L (ref 98–110)
Creat: 0.98 mg/dL (ref 0.70–1.28)
Globulin: 3.1 g/dL (calc) (ref 1.9–3.7)
Glucose, Bld: 154 mg/dL — ABNORMAL HIGH (ref 65–99)
Potassium: 4.1 mmol/L (ref 3.5–5.3)
Sodium: 140 mmol/L (ref 135–146)
Total Bilirubin: 0.6 mg/dL (ref 0.2–1.2)
Total Protein: 7.3 g/dL (ref 6.1–8.1)
eGFR: 82 mL/min/{1.73_m2} (ref >=60)

## 2021-12-07 LAB — PROTIME-INR
INR: 1
Prothrombin Time: 10.4 s (ref 9.0–11.5)

## 2021-12-27 ENCOUNTER — Encounter: Payer: Self-pay | Admitting: *Deleted

## 2021-12-27 NOTE — Patient Instructions (Signed)
[name] [name]  12/27/2021     '@PREFPERIOPPHARMACY'$ @   Your procedure is scheduled on  01/01/2022.   Report to [name] at  0730 A.M.   Call this number if you have problems the morning of surgery:  [phone]   Remember:  Follow the diet and prep instructions given to you by the office.     Take 32.5 units of lantus the night before your procedure.      DO NOT take any medications for diabetes the morning of your procedure.     Take these medicines the morning of surgery with A SIP OF WATER                               coreg, neurontin.     Do not wear jewelry, make-up or nail polish.  Do not wear lotions, powders, or perfumes, or deodorant.  Do not shave 48 hours prior to surgery.  Men may shave face and neck.  Do not bring valuables to the hospital.  [facility] is not responsible for any belongings or valuables.  Contacts, dentures or bridgework may not be worn into surgery.  Leave your suitcase in the car.  After surgery it may be brought to your room.  For patients admitted to the hospital, discharge time will be determined by your treatment team.  Patients discharged the day of surgery will not be allowed to drive home and must have someone with them for 24 hours.    Special instructions:   DO NOT smoke tobacco or vape for 24 hours before your procedure.  Please read over the following fact sheets that you were given. Anesthesia Post-op Instructions and Care and Recovery After Surgery      Upper Endoscopy, Adult, Care After This sheet gives you information about how to care for yourself after your procedure. Your health care provider may also give you more specific instructions. If you have problems or questions, contact your health care provider. What can I expect after the procedure? After the procedure, it is common to have: A sore throat. Mild stomach pain or discomfort. Bloating. Nausea. Follow these instructions at home:  Follow  instructions from your health care provider about what to eat or drink after your procedure. Return to your normal activities as told by your health care provider. Ask your health care provider what activities are safe for you. Take over-the-counter and prescription medicines only as told by your health care provider. If you were given a sedative during the procedure, it can affect you for several hours. Do not drive or operate machinery until your health care provider says that it is safe. Keep all follow-up visits as told by your health care provider. This is important. Contact a health care provider if you have: A sore throat that lasts longer than one day. Trouble swallowing. Get help right away if: You vomit blood or your vomit looks like coffee grounds. You have: A fever. Bloody, black, or tarry stools. A severe sore throat or you cannot swallow. Difficulty breathing. Severe pain in your chest or abdomen. Summary After the procedure, it is common to have a sore throat, mild stomach discomfort, bloating, and nausea. If you were given a sedative during the procedure, it can affect you for several hours. Do not drive or operate machinery until your health care provider says that it is safe. Follow instructions from your  health care provider about what to eat or drink after your procedure. Return to your normal activities as told by your health care provider. This information is not intended to replace advice given to you by your health care provider. Make sure you discuss any questions you have with your health care provider. Document Revised: 03/20/2019 Document Reviewed: 10/14/2017 Elsevier Patient Education  [place]. Colonoscopy, Adult, Care After The following information offers guidance on how to care for yourself after your procedure. Your health care provider may also give you more specific instructions. If you have problems or questions, contact your health care  provider. What can I expect after the procedure? After the procedure, it is common to have: A small amount of blood in your stool for 24 hours after the procedure. Some gas. Mild cramping or bloating of your abdomen. Follow these instructions at home: Eating and drinking  Drink enough fluid to keep your urine pale yellow. Follow instructions from your health care provider about eating or drinking restrictions. Resume your normal diet as told by your health care provider. Avoid heavy or fried foods that are hard to digest. Activity Rest as told by your health care provider. Avoid sitting for a long time without moving. Get up to take short walks every 1-2 hours. This is important to improve blood flow and breathing. Ask for help if you feel weak or unsteady. Return to your normal activities as told by your health care provider. Ask your health care provider what activities are safe for you. Managing cramping and bloating  Try walking around when you have cramps or feel bloated. If directed, apply heat to your abdomen as told by your health care provider. Use the heat source that your health care provider recommends, such as a moist heat pack or a heating pad. Place a towel between your skin and the heat source. Leave the heat on for 20-30 minutes. Remove the heat if your skin turns bright red. This is especially important if you are unable to feel pain, heat, or cold. You have a greater risk of getting burned. General instructions If you were given a sedative during the procedure, it can affect you for several hours. Do not drive or operate machinery until your health care provider says that it is safe. For the first 24 hours after the procedure: Do not sign important documents. Do not drink alcohol. Do your regular daily activities at a slower pace than normal. Eat soft foods that are easy to digest. Take over-the-counter and prescription medicines only as told by your health care  provider. Keep all follow-up visits. This is important. Contact a health care provider if: You have blood in your stool 2-3 days after the procedure. Get help right away if: You have more than a small spotting of blood in your stool. You have large blood clots in your stool. You have swelling of your abdomen. You have nausea or vomiting. You have a fever. You have increasing pain in your abdomen that is not relieved with medicine. These symptoms may be an emergency. Get help right away. Call 911. Do not wait to see if the symptoms will go away. Do not drive yourself to the hospital. Summary After the procedure, it is common to have a small amount of blood in your stool. You may also have mild cramping and bloating of your abdomen. If you were given a sedative during the procedure, it can affect you for several hours. Do not drive or operate  machinery until your health care provider says that it is safe. Get help right away if you have a lot of blood in your stool, nausea or vomiting, a fever, or increased pain in your abdomen. This information is not intended to replace advice given to you by your health care provider. Make sure you discuss any questions you have with your health care provider. Document Revised: 01/04/2021 Document Reviewed: 01/04/2021 Elsevier Patient Education  [place] After This sheet gives you information about how to care for yourself after your procedure. Your health care provider may also give you more specific instructions. If you have problems or questions, contact your health care provider. What can I expect after the procedure? After the procedure, it is common to have: Tiredness. Forgetfulness about what happened after the procedure. Impaired judgment for important decisions. Nausea or vomiting. Some difficulty with balance. Follow these instructions at home: For the time period you were told by your health care  provider:     Rest as needed. Do not participate in activities where you could fall or become injured. Do not drive or use machinery. Do not drink alcohol. Do not take sleeping pills or medicines that cause drowsiness. Do not make important decisions or sign legal documents. Do not take care of children on your own. Eating and drinking Follow the diet that is recommended by your health care provider. Drink enough fluid to keep your urine pale yellow. If you vomit: Drink water, juice, or soup when you can drink without vomiting. Make sure you have little or no nausea before eating solid foods. General instructions Have a responsible adult stay with you for the time you are told. It is important to have someone help care for you until you are awake and alert. Take over-the-counter and prescription medicines only as told by your health care provider. If you have sleep apnea, surgery and certain medicines can increase your risk for breathing problems. Follow instructions from your health care provider about wearing your sleep device: Anytime you are sleeping, including during daytime naps. While taking prescription pain medicines, sleeping medicines, or medicines that make you drowsy. Avoid smoking. Keep all follow-up visits as told by your health care provider. This is important. Contact a health care provider if: You keep feeling nauseous or you keep vomiting. You feel light-headed. You are still sleepy or having trouble with balance after 24 hours. You develop a rash. You have a fever. You have redness or swelling around the IV site. Get help right away if: You have trouble breathing. You have new-onset confusion at home. Summary For several hours after your procedure, you may feel tired. You may also be forgetful and have poor judgment. Have a responsible adult stay with you for the time you are told. It is important to have someone help care for you until you are awake and  alert. Rest as told. Do not drive or operate machinery. Do not drink alcohol or take sleeping pills. Get help right away if you have trouble breathing, or if you suddenly become confused. This information is not intended to replace advice given to you by your health care provider. Make sure you discuss any questions you have with your health care provider. Document Revised: 04/18/2021 Document Reviewed: 04/16/2019 Elsevier Patient Education  [place].

## 2021-12-28 ENCOUNTER — Encounter (HOSPITAL_COMMUNITY)
Admission: RE | Admit: 2021-12-28 | Discharge: 2021-12-28 | Disposition: A | Discharge status: Home / Self Care | Payer: PPO | Source: Ambulatory Visit | Attending: Internal Medicine | Admitting: Internal Medicine

## 2021-12-28 ENCOUNTER — Other Ambulatory Visit: Payer: Self-pay

## 2021-12-28 ENCOUNTER — Encounter (HOSPITAL_COMMUNITY): Payer: Self-pay

## 2021-12-28 VITALS — HR 1 | Temp 97.5°F | Resp 18 | Ht 66.0 in | Wt 205.5 lb

## 2021-12-28 DIAGNOSIS — Z01810 Encounter for preprocedural cardiovascular examination: Secondary | ICD-10-CM | POA: Diagnosis not present

## 2021-12-28 DIAGNOSIS — I10 Essential (primary) hypertension: Secondary | ICD-10-CM | POA: Diagnosis not present

## 2022-01-01 ENCOUNTER — Ambulatory Visit (HOSPITAL_COMMUNITY): Payer: PPO | Admitting: Anesthesiology

## 2022-01-01 ENCOUNTER — Encounter (HOSPITAL_COMMUNITY): Payer: Self-pay

## 2022-01-01 ENCOUNTER — Ambulatory Visit (HOSPITAL_COMMUNITY)
Admission: RE | Admit: 2022-01-01 | Discharge: 2022-01-01 | Disposition: A | Discharge status: Home / Self Care | Payer: PPO | Source: Ambulatory Visit | Attending: Internal Medicine | Admitting: Internal Medicine

## 2022-01-01 ENCOUNTER — Encounter (HOSPITAL_COMMUNITY)
Admission: RE | Disposition: A | Discharge status: Home / Self Care | Payer: Self-pay | Source: Ambulatory Visit | Attending: Internal Medicine

## 2022-01-01 ENCOUNTER — Ambulatory Visit (HOSPITAL_BASED_OUTPATIENT_CLINIC_OR_DEPARTMENT_OTHER): Payer: PPO | Admitting: Anesthesiology

## 2022-01-01 DIAGNOSIS — I110 Hypertensive heart disease with heart failure: Secondary | ICD-10-CM | POA: Diagnosis not present

## 2022-01-01 DIAGNOSIS — D123 Benign neoplasm of transverse colon: Secondary | ICD-10-CM | POA: Insufficient documentation

## 2022-01-01 DIAGNOSIS — K2100 Gastro-esophageal reflux disease with esophagitis, without bleeding: Secondary | ICD-10-CM

## 2022-01-01 DIAGNOSIS — Z8673 Personal history of transient ischemic attack (TIA), and cerebral infarction without residual deficits: Secondary | ICD-10-CM | POA: Insufficient documentation

## 2022-01-01 DIAGNOSIS — Z794 Long term (current) use of insulin: Secondary | ICD-10-CM | POA: Diagnosis not present

## 2022-01-01 DIAGNOSIS — K648 Other hemorrhoids: Secondary | ICD-10-CM | POA: Diagnosis not present

## 2022-01-01 DIAGNOSIS — Z1211 Encounter for screening for malignant neoplasm of colon: Secondary | ICD-10-CM | POA: Insufficient documentation

## 2022-01-01 DIAGNOSIS — K2970 Gastritis, unspecified, without bleeding: Secondary | ICD-10-CM

## 2022-01-01 DIAGNOSIS — Z1212 Encounter for screening for malignant neoplasm of rectum: Secondary | ICD-10-CM | POA: Diagnosis not present

## 2022-01-01 DIAGNOSIS — K7581 Nonalcoholic steatohepatitis (NASH): Secondary | ICD-10-CM | POA: Insufficient documentation

## 2022-01-01 DIAGNOSIS — K635 Polyp of colon: Secondary | ICD-10-CM | POA: Diagnosis not present

## 2022-01-01 DIAGNOSIS — E119 Type 2 diabetes mellitus without complications: Secondary | ICD-10-CM | POA: Insufficient documentation

## 2022-01-01 DIAGNOSIS — K7030 Alcoholic cirrhosis of liver without ascites: Secondary | ICD-10-CM

## 2022-01-01 DIAGNOSIS — Z79899 Other long term (current) drug therapy: Secondary | ICD-10-CM | POA: Diagnosis not present

## 2022-01-01 DIAGNOSIS — K7460 Unspecified cirrhosis of liver: Secondary | ICD-10-CM

## 2022-01-01 DIAGNOSIS — R195 Other fecal abnormalities: Secondary | ICD-10-CM | POA: Insufficient documentation

## 2022-01-01 DIAGNOSIS — K51418 Inflammatory polyps of colon with other complication: Secondary | ICD-10-CM | POA: Diagnosis not present

## 2022-01-01 DIAGNOSIS — I509 Heart failure, unspecified: Secondary | ICD-10-CM | POA: Insufficient documentation

## 2022-01-01 DIAGNOSIS — I2510 Atherosclerotic heart disease of native coronary artery without angina pectoris: Secondary | ICD-10-CM | POA: Insufficient documentation

## 2022-01-01 DIAGNOSIS — D124 Benign neoplasm of descending colon: Secondary | ICD-10-CM | POA: Diagnosis not present

## 2022-01-01 DIAGNOSIS — K31819 Angiodysplasia of stomach and duodenum without bleeding: Secondary | ICD-10-CM | POA: Diagnosis not present

## 2022-01-01 DIAGNOSIS — Z139 Encounter for screening, unspecified: Secondary | ICD-10-CM | POA: Diagnosis not present

## 2022-01-01 HISTORY — PX: POLYPECTOMY: SHX5525

## 2022-01-01 HISTORY — PX: BIOPSY: SHX5522

## 2022-01-01 HISTORY — PX: ESOPHAGOGASTRODUODENOSCOPY (EGD) WITH PROPOFOL: SHX5813

## 2022-01-01 HISTORY — PX: COLONOSCOPY WITH PROPOFOL: SHX5780

## 2022-01-01 LAB — GLUCOSE, CAPILLARY: Glucose-Capillary: 124 mg/dL — ABNORMAL HIGH (ref 70–99)

## 2022-01-01 SURGERY — COLONOSCOPY WITH PROPOFOL
Anesthesia: General

## 2022-01-01 MED ORDER — PROPOFOL 500 MG/50ML IV EMUL
INTRAVENOUS | Status: DC | PRN
  Administered 2022-01-01: 150 ug/kg/min via INTRAVENOUS

## 2022-01-01 MED ORDER — PANTOPRAZOLE SODIUM 40 MG PO TBEC: 40.0000 mg | DELAYED_RELEASE_TABLET | Freq: Every day | ORAL | 11 refills | Status: AC

## 2022-01-01 MED ORDER — PROPOFOL 10 MG/ML IV BOLUS
INTRAVENOUS | Status: DC | PRN
  Administered 2022-01-01: 100 mg via INTRAVENOUS

## 2022-01-01 MED ORDER — PROPOFOL 1000 MG/100ML IV EMUL
INTRAVENOUS | Status: AC
  Filled 2022-01-01: qty 100

## 2022-01-01 MED ORDER — PROPOFOL 500 MG/50ML IV EMUL
INTRAVENOUS | Status: AC
  Filled 2022-01-01: qty 150

## 2022-01-01 MED ORDER — LACTATED RINGERS IV SOLN: INTRAVENOUS | Status: DC

## 2022-01-01 MED ORDER — LIDOCAINE HCL (CARDIAC) PF 100 MG/5ML IV SOSY
PREFILLED_SYRINGE | INTRAVENOUS | Status: DC | PRN
  Administered 2022-01-01: 50 mg via INTRAVENOUS

## 2022-01-01 NOTE — Op Note (Signed)
[facility] Patient Name: [name] [name] Procedure Date: 01/01/2022 9:06 AM MRN: [id number] Date of Birth: January 26, 1951 Attending MD: Elon Alas. Abbey Chatters DO CSN: [id number] Age: 71 Admit Type: Outpatient Procedure:                Colonoscopy Indications:              Positive Cologuard test Providers:                Elon Alas. Abbey Chatters, DO, Janeece Riggers, RN, Ladoris Gene, Technician, Aram Candela Referring MD:              Medicines:                See the Anesthesia note for documentation of the                            administered medications Complications:            No immediate complications. Estimated Blood Loss:     Estimated blood loss was minimal. Procedure:                Pre-Anesthesia Assessment:                           - The anesthesia plan was to use monitored                            anesthesia care (MAC).                           After obtaining informed consent, the colonoscope                            was passed under direct vision. Throughout the                            procedure, the patient's blood pressure, pulse, and                            oxygen saturations were monitored continuously. The                            [id number]-HQ190L ([id number]) scope was introduced through                            the anus and advanced to the the cecum, identified                            by appendiceal orifice and ileocecal valve. The                            colonoscopy was performed without difficulty. The                            patient tolerated the procedure well. The  quality                            of the bowel preparation was evaluated using the                            BBPS [facility] Bowel Preparation Scale) with scores                            of: Right Colon = 3, Transverse Colon = 3 and Left                            Colon = 3 (entire mucosa seen well with no residual                            staining, small fragments  of stool or opaque                            liquid). The total BBPS score equals 9. Scope In: 9:24:26 AM Scope Out: 9:42:26 AM Scope Withdrawal Time: 0 hours 12 minutes 43 seconds  Total Procedure Duration: 0 hours 18 minutes 0 seconds  Findings:      The perianal and digital rectal examinations were normal.      Non-bleeding internal hemorrhoids were found during endoscopy.      A 4 mm polyp was found in the transverse colon. The polyp was sessile.       The polyp was removed with a cold snare. Resection and retrieval were       complete.      A 8 mm polyp was found in the transverse colon. The polyp was sessile.       The polyp was removed with a cold snare. Resection and retrieval were       complete.      A 5 mm polyp was found in the descending colon. The polyp was sessile.       The polyp was removed with a cold snare. Resection and retrieval were       complete.      The exam was otherwise without abnormality. Impression:               - Non-bleeding internal hemorrhoids.                           - One 4 mm polyp in the transverse colon, removed                            with a cold snare. Resected and retrieved.                           - One 8 mm polyp in the transverse colon, removed                            with a cold snare. Resected and retrieved.                           - One 5 mm  polyp in the descending colon, removed                            with a cold snare. Resected and retrieved.                           - The examination was otherwise normal. Moderate Sedation:      Per Anesthesia Care Recommendation:           - Patient has a contact number available for                            emergencies. The signs and symptoms of potential                            delayed complications were discussed with the                            patient. Return to normal activities tomorrow.                            Written discharge instructions were provided to the                             patient.                           - Resume previous diet.                           - Continue present medications.                           - Await pathology results.                           - Repeat colonoscopy in 5 years for surveillance.                           - Return to GI clinic in 6 months. Procedure Code(s):        --- Professional ---                           [phone], Colonoscopy, flexible; with removal of                            tumor(s), polyp(s), or other lesion(s) by snare                            technique Diagnosis Code(s):        --- Professional ---                           K63.5, Polyp of colon                           K64.8, Other hemorrhoids  R19.5, Other fecal abnormalities CPT copyright 2019 American Medical Association. All rights reserved. The codes documented in this report are preliminary and upon coder review may  be revised to meet current compliance requirements. Elon Alas. Abbey Chatters, DO [place] Abbey Chatters, DO 01/01/2022 9:44:47 AM This report has been signed electronically. Number of Addenda: 0

## 2022-01-01 NOTE — Anesthesia Postprocedure Evaluation (Signed)
Anesthesia Post Note  Patient: GURVEER COLUCCI  Procedure(s) Performed: COLONOSCOPY WITH PROPOFOL ESOPHAGOGASTRODUODENOSCOPY (EGD) WITH PROPOFOL BIOPSY POLYPECTOMY  Patient location during evaluation: Phase II Anesthesia Type: General Level of consciousness: awake and alert and oriented Pain management: pain level controlled Vital Signs Assessment: post-procedure vital signs reviewed and stable Respiratory status: spontaneous breathing, nonlabored ventilation and respiratory function stable Cardiovascular status: blood pressure returned to baseline and stable Postop Assessment: no apparent nausea or vomiting Anesthetic complications: no   No notable events documented.   Last Vitals:  Vitals:   01/01/22 0758 01/01/22 0945  BP: 132/81 (!) 117/58  Pulse: 79 84  Resp: 16 15  Temp: 36.7 C 36.7 C  SpO2: 97% 95%    Last Pain:  Vitals:   01/01/22 0945  TempSrc: Oral  PainSc:                  Helaina Stefano C Winford Hehn

## 2022-01-01 NOTE — Interval H&P Note (Signed)
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  The various methods of treatment have been discussed with the patient and family. After consideration of risks, benefits and other options for treatment, the patient has consented to  Procedure(s) with comments: COLONOSCOPY WITH PROPOFOL (N/A) - 930 ESOPHAGOGASTRODUODENOSCOPY (EGD) WITH PROPOFOL (N/A) as a surgical intervention.  The patient's history has been reviewed, patient examined, no change in status, stable for surgery.  I have reviewed the patient's chart and labs.  Questions were answered to the patient's satisfaction.     [name]

## 2022-01-01 NOTE — Op Note (Signed)
[facility] Patient Name: [name] [name] Procedure Date: 01/01/2022 9:05 AM MRN: [id number] Date of Birth: May 05, 1951 Attending MD: Elon Alas. Abbey Chatters DO CSN: [id number] Age: 71 Admit Type: Outpatient Procedure:                Upper GI endoscopy Indications:              Cirrhosis rule out esophageal varices Providers:                Elon Alas. Abbey Chatters, DO, Janeece Riggers, RN, Ladoris Gene, Technician, Aram Candela Referring MD:              Medicines:                See the Anesthesia note for documentation of the                            administered medications Complications:            No immediate complications. Estimated Blood Loss:     Estimated blood loss was minimal. Procedure:                Pre-Anesthesia Assessment:                           - The anesthesia plan was to use monitored                            anesthesia care (MAC).                           After obtaining informed consent, the endoscope was                            passed under direct vision. Throughout the                            procedure, the patient's blood pressure, pulse, and                            oxygen saturations were monitored continuously. The                            [id number]-H190 ([id number]) scope was introduced through the                            mouth, and advanced to the second part of duodenum.                            The upper GI endoscopy was accomplished without                            difficulty. The patient tolerated the procedure                            well.  Scope In: 9:14:49 AM Scope Out: 9:17:58 AM Total Procedure Duration: 0 hours 3 minutes 9 seconds  Findings:      There is no endoscopic evidence of varices in the entire esophagus.      LA Grade A (one or more mucosal breaks less than 5 mm, not extending       between tops of 2 mucosal folds) esophagitis with no bleeding was found       at the gastroesophageal junction.       Patchy mild inflammation characterized by erythema was found in the       gastric body and in the gastric antrum. Biopsies were taken with a cold       forceps for Helicobacter pylori testing.      The duodenal bulb, first portion of the duodenum and second portion of       the duodenum were normal. Impression:               - LA Grade A reflux esophagitis with no bleeding.                           - Gastritis. Biopsied.                           - Normal duodenal bulb, first portion of the                            duodenum and second portion of the duodenum. Moderate Sedation:      Per Anesthesia Care Recommendation:           - Patient has a contact number available for                            emergencies. The signs and symptoms of potential                            delayed complications were discussed with the                            patient. Return to normal activities tomorrow.                            Written discharge instructions were provided to the                            patient.                           - Resume previous diet.                           - Continue present medications.                           - Await pathology results.                           - Repeat upper endoscopy in 3 years for screening  purposes.                           - Use Protonix (pantoprazole) 40 mg PO daily.                           - Return to GI clinic in 6 months. 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 All rights reserved. The codes documented in this report are preliminary and upon coder review may  be revised to meet current compliance requirements. Elon Alas. Abbey Chatters, DO Lavelle Abbey Chatters, DO 01/01/2022 9:20:57 AM This report has been signed electronically. Number of Addenda: 0

## 2022-01-01 NOTE — Transfer of Care (Signed)
Immediate Anesthesia Transfer of Care Note  Patient: [name] [name]  Procedure(s) Performed: COLONOSCOPY WITH PROPOFOL ESOPHAGOGASTRODUODENOSCOPY (EGD) WITH PROPOFOL BIOPSY POLYPECTOMY  Patient Location: Short Stay  Anesthesia Type:General  Level of Consciousness: awake  Airway & Oxygen Therapy: Patient Spontanous Breathing  Post-op Assessment: Report given to RN and Post -op Vital signs reviewed and stable  Post vital signs: Reviewed and stable  Last Vitals:  Vitals Value Taken Time  BP 117/58 01/01/22 0945  Temp 36.7 C 01/01/22 0945  Pulse 84 01/01/22 0945  Resp 15 01/01/22 0945  SpO2 95 % 01/01/22 0945    Last Pain:  Vitals:   01/01/22 0945  TempSrc: Oral  PainSc:          Complications: No notable events documented.

## 2022-01-01 NOTE — Discharge Instructions (Addendum)
EGD Discharge instructions Please read the instructions outlined below and refer to this sheet in the next few weeks. These discharge instructions provide you with general information on caring for yourself after you leave the hospital. Your doctor may also give you specific instructions. While your treatment has been planned according to the most current medical practices available, unavoidable complications occasionally occur. If you have any problems or questions after discharge, please call your doctor. ACTIVITY You may resume your regular activity but move at a slower pace for the next 24 hours.  Take frequent rest periods for the next 24 hours.  Walking will help expel (get rid of) the air and reduce the bloated feeling in your abdomen.  No driving for 24 hours (because of the anesthesia (medicine) used during the test).  You may shower.  Do not sign any important legal documents or operate any machinery for 24 hours (because of the anesthesia used during the test).  NUTRITION Drink plenty of fluids.  You may resume your normal diet.  Begin with a light meal and progress to your normal diet.  Avoid alcoholic beverages for 24 hours or as instructed by your caregiver.  MEDICATIONS You may resume your normal medications unless your caregiver tells you otherwise.  WHAT YOU CAN EXPECT TODAY You may experience abdominal discomfort such as a feeling of fullness or "gas" pains.  FOLLOW-UP Your doctor will discuss the results of your test with you.  SEEK IMMEDIATE MEDICAL ATTENTION IF ANY OF THE FOLLOWING OCCUR: Excessive nausea (feeling sick to your stomach) and/or vomiting.  Severe abdominal pain and distention (swelling).  Trouble swallowing.  Temperature over 101 F (37.8 C).  Rectal bleeding or vomiting of blood.     Colonoscopy Discharge Instructions  Read the instructions outlined below and refer to this sheet in the next few weeks. These discharge instructions provide you with  general information on caring for yourself after you leave the hospital. Your doctor may also give you specific instructions. While your treatment has been planned according to the most current medical practices available, unavoidable complications occasionally occur.   ACTIVITY You may resume your regular activity, but move at a slower pace for the next 24 hours.  Take frequent rest periods for the next 24 hours.  Walking will help get rid of the air and reduce the bloated feeling in your belly (abdomen).  No driving for 24 hours (because of the medicine (anesthesia) used during the test).   Do not sign any important legal documents or operate any machinery for 24 hours (because of the anesthesia used during the test).  NUTRITION Drink plenty of fluids.  You may resume your normal diet as instructed by your doctor.  Begin with a light meal and progress to your normal diet. Heavy or fried foods are harder to digest and may make you feel sick to your stomach (nauseated).  Avoid alcoholic beverages for 24 hours or as instructed.  MEDICATIONS You may resume your normal medications unless your doctor tells you otherwise.  WHAT YOU CAN EXPECT TODAY Some feelings of bloating in the abdomen.  Passage of more gas than usual.  Spotting of blood in your stool or on the toilet paper.  IF YOU HAD POLYPS REMOVED DURING THE COLONOSCOPY: No aspirin products for 7 days or as instructed.  No alcohol for 7 days or as instructed.  Eat a soft diet for the next 24 hours.  FINDING OUT THE RESULTS OF YOUR TEST Not all test results are  available during your visit. If your test results are not back during the visit, make an appointment with your caregiver to find out the results. Do not assume everything is normal if you have not heard from your caregiver or the medical facility. It is important for you to follow up on all of your test results.  SEEK IMMEDIATE MEDICAL ATTENTION IF: You have more than a spotting of  blood in your stool.  Your belly is swollen (abdominal distention).  You are nauseated or vomiting.  You have a temperature over 101.  You have abdominal pain or discomfort that is severe or gets worse throughout the day.   Your EGD revealed mild amount inflammation in your stomach.  I took biopsies of this to rule out infection with a bacteria called H. pylori.  Await pathology results, my office will contact you. I did not see any evidence of esophageal varices. Recommend repeat EGD in 3 years.   I am going to start you on a new medication for the inflammation of stomach called pantoprazole 40 mg daily.  I will send this to your pharmacy.  Your colonoscopy revealed 3 polyp(s) which I removed successfully. Await pathology results, my office will contact you. I recommend repeating colonoscopy in 5 years for surveillance purposes.   Follow up with Dr. Abbey Chatters in 6 months.   I hope you have a great rest of your week!  Elon Alas. Abbey Chatters, D.O. Gastroenterology and Hepatology [facility]

## 2022-01-01 NOTE — Anesthesia Preprocedure Evaluation (Signed)
Anesthesia Evaluation  Patient identified by MRN, date of birth, ID band Patient awake    Reviewed: Allergy & Precautions, NPO status , Patient's Chart, lab work & pertinent test results, reviewed documented beta blocker date and time   Airway Mallampati: III  TM Distance: >3 FB Neck ROM: Full    Dental  (+) Missing, Dental Advisory Given   Pulmonary neg pulmonary ROS,    Pulmonary exam normal breath sounds clear to auscultation       Cardiovascular hypertension, Pt. on medications and Pt. on home beta blockers + CAD, +CHF and + DOE  Normal cardiovascular exam Rhythm:Regular Rate:Normal     Neuro/Psych PSYCHIATRIC DISORDERS Anxiety Depression CVA, Residual Symptoms    GI/Hepatic negative GI ROS, (+) Cirrhosis     substance abuse  marijuana use,   Endo/Other  diabetes, Well Controlled, Type 2, Insulin Dependent  Renal/GU negative Renal ROS  negative genitourinary   Musculoskeletal  (+) Arthritis , Osteoarthritis,    Abdominal   Peds negative pediatric ROS (+)  Hematology  (+) Blood dyscrasia, anemia ,   Anesthesia Other Findings   Reproductive/Obstetrics negative OB ROS                             Anesthesia Physical Anesthesia Plan  ASA: 3  Anesthesia Plan: General   Post-op Pain Management: Minimal or no pain anticipated   Induction: Intravenous  PONV Risk Score and Plan: Propofol infusion  Airway Management Planned: Nasal Cannula and Natural Airway  Additional Equipment:   Intra-op Plan:   Post-operative Plan:   Informed Consent: I have reviewed the patients History and Physical, chart, labs and discussed the procedure including the risks, benefits and alternatives for the proposed anesthesia with the patient or authorized representative who has indicated his/her understanding and acceptance.     Dental advisory given  Plan Discussed with: CRNA and  Surgeon  Anesthesia Plan Comments:         Anesthesia Quick Evaluation

## 2022-01-02 LAB — SURGICAL PATHOLOGY

## 2022-01-04 ENCOUNTER — Encounter (HOSPITAL_COMMUNITY): Payer: Self-pay | Admitting: Internal Medicine

## 2022-06-07 ENCOUNTER — Encounter: Payer: Self-pay | Admitting: Internal Medicine

## 2022-10-18 DIAGNOSIS — X32XXXD Exposure to sunlight, subsequent encounter: Secondary | ICD-10-CM | POA: Diagnosis not present

## 2022-10-18 DIAGNOSIS — L570 Actinic keratosis: Secondary | ICD-10-CM | POA: Diagnosis not present

## 2022-10-18 DIAGNOSIS — S90829A Blister (nonthermal), unspecified foot, initial encounter: Secondary | ICD-10-CM | POA: Diagnosis not present

## 2022-10-18 DIAGNOSIS — L218 Other seborrheic dermatitis: Secondary | ICD-10-CM | POA: Diagnosis not present

## 2022-12-20 ENCOUNTER — Ambulatory Visit (INDEPENDENT_AMBULATORY_CARE_PROVIDER_SITE_OTHER): Payer: PPO

## 2022-12-20 ENCOUNTER — Encounter: Payer: Self-pay | Admitting: Podiatry

## 2022-12-20 ENCOUNTER — Ambulatory Visit (INDEPENDENT_AMBULATORY_CARE_PROVIDER_SITE_OTHER): Payer: No Typology Code available for payment source | Admitting: Podiatry

## 2022-12-20 VITALS — BP 173/101 | HR 70

## 2022-12-20 DIAGNOSIS — L03032 Cellulitis of left toe: Secondary | ICD-10-CM

## 2022-12-20 DIAGNOSIS — M79675 Pain in left toe(s): Secondary | ICD-10-CM

## 2022-12-20 DIAGNOSIS — B351 Tinea unguium: Secondary | ICD-10-CM

## 2022-12-20 DIAGNOSIS — L97421 Non-pressure chronic ulcer of left heel and midfoot limited to breakdown of skin: Secondary | ICD-10-CM | POA: Diagnosis not present

## 2022-12-20 DIAGNOSIS — L02612 Cutaneous abscess of left foot: Secondary | ICD-10-CM

## 2022-12-20 DIAGNOSIS — M79674 Pain in right toe(s): Secondary | ICD-10-CM

## 2022-12-20 DIAGNOSIS — E1149 Type 2 diabetes mellitus with other diabetic neurological complication: Secondary | ICD-10-CM

## 2022-12-20 DIAGNOSIS — E08621 Diabetes mellitus due to underlying condition with foot ulcer: Secondary | ICD-10-CM

## 2022-12-20 MED ORDER — DOXYCYCLINE HYCLATE 100 MG PO TABS: 100.0000 mg | ORAL_TABLET | Freq: Two times a day (BID) | ORAL | 0 refills | Status: DC

## 2022-12-20 NOTE — Progress Notes (Signed)
Subjective:   Patient ID: [name] [name], male   DOB: 72 y.o.   MRN: [id number]   HPI Chief Complaint  Patient presents with   Diabetes    "I have a place on my heel.  Cut my toenails." N - painful heel L - medial heel left D - 2-3 mos O - suddenly C - blister, sore A - put shoes on or off T - none    72 year old male presents the office with above concerns. He has a ulcer on the left heel that started a  couple months.  Did have a blister which came off.  Area is sore with pressure.  He has not seen any drainage or pus.  No recent treatment.  He also has thick, elongated toenails that he cannot trim himself and they are causing discomfort.  BS 250+  Review of Systems  All other systems reviewed and are negative.       Objective:  Physical Exam  General: AAO x3, NAD  Dermatological: On the medial aspect of the left heel is what appears to be an old blister and there is localized edema and erythema there is no fluctuation or crepitation.  No malodor.  See picture below.  Nails are hypertrophic, dystrophic with yellow, brown discoloration.  No edema, erythema Infection of the toenail sites.  No other open lesions are noted.    Vascular: Dorsalis Pedis artery and Posterior Tibial artery pedal pulses are palpable bilateral with immedate capillary fill time.  There is no pain with calf compression, swelling, warmth, erythema.   Neruologic: Sensation decreased  Musculoskeletal: No other areas of discomfort except noted above on the area of the wound, nails.      Assessment:   Ulceration left heel, symptomatic onychomycosis     Plan:  -Treatment options discussed including all alternatives, risks, and complications -Etiology of symptoms were discussed -X-rays were obtained and reviewed with the patient.  3 views left foot were obtained.  No evidence of acute fracture.  No soft tissue emphysema.  No cortical destruction suggest osteomyelitis.  Posterior calcaneal spurring  is present.  Arthritic changes present of the midfoot. -There is no significant tissue debris.  Did ordered ultrasound to rule out any abscess or fluid collection.  Prescribed doxycycline.  Offloading at all times. -Vascular: ABI ordered -Nails sharply debrided x 10 and complications or bleeding. -Monitor for any clinical signs or symptoms of infection and directed to call the office immediately should any occur or go to the ER.  Return in about 2 weeks (around 01/03/2023).  Vivi Barrack DPM

## 2022-12-24 ENCOUNTER — Encounter: Payer: Self-pay | Admitting: Podiatry

## 2022-12-26 ENCOUNTER — Ambulatory Visit (HOSPITAL_COMMUNITY)
Admission: RE | Admit: 2022-12-26 | Discharge: 2022-12-26 | Disposition: A | Discharge status: Home / Self Care | Payer: Medicare HMO | Source: Ambulatory Visit | Attending: Podiatry | Admitting: Podiatry

## 2022-12-26 DIAGNOSIS — L03032 Cellulitis of left toe: Secondary | ICD-10-CM | POA: Insufficient documentation

## 2022-12-26 DIAGNOSIS — L02612 Cutaneous abscess of left foot: Secondary | ICD-10-CM | POA: Diagnosis not present

## 2022-12-26 LAB — VAS US ABI WITH/WO TBI
Left ABI: 1.11
Right ABI: 1.12

## 2023-01-03 ENCOUNTER — Ambulatory Visit (INDEPENDENT_AMBULATORY_CARE_PROVIDER_SITE_OTHER): Payer: Medicare HMO | Admitting: Podiatry

## 2023-01-03 DIAGNOSIS — E08621 Diabetes mellitus due to underlying condition with foot ulcer: Secondary | ICD-10-CM

## 2023-01-03 DIAGNOSIS — L97421 Non-pressure chronic ulcer of left heel and midfoot limited to breakdown of skin: Secondary | ICD-10-CM | POA: Diagnosis not present

## 2023-01-03 DIAGNOSIS — L03032 Cellulitis of left toe: Secondary | ICD-10-CM | POA: Diagnosis not present

## 2023-01-03 DIAGNOSIS — L02612 Cutaneous abscess of left foot: Secondary | ICD-10-CM | POA: Diagnosis not present

## 2023-01-03 MED ORDER — SULFAMETHOXAZOLE-TRIMETHOPRIM 800-160 MG PO TABS: 1.0000 | ORAL_TABLET | Freq: Two times a day (BID) | ORAL | 0 refills | Status: DC

## 2023-01-08 ENCOUNTER — Other Ambulatory Visit: Payer: Self-pay | Admitting: Podiatry

## 2023-01-08 ENCOUNTER — Telehealth: Payer: Self-pay | Admitting: Podiatry

## 2023-01-08 MED ORDER — SULFAMETHOXAZOLE-TRIMETHOPRIM 800-160 MG PO TABS: 1.0000 | ORAL_TABLET | Freq: Two times a day (BID) | ORAL | 0 refills | Status: AC

## 2023-01-08 NOTE — Telephone Encounter (Signed)
Pt 's daughter stated that Rx was never received at the pharmacy due to power outage, please resend to pharmacy. Please advise

## 2023-01-09 NOTE — Progress Notes (Signed)
Subjective:   Patient ID: [name] [name], male   DOB: 72 y.o.   MRN: [id number]   HPI Chief Complaint  Patient presents with   Wound Check    " It's been hurting a lot"     72 year old male presents the office today for follow-up evaluation of the wound on his left heel.  Is not able to get the ultrasound.  Denies any fevers or chills.  BS 250+  Review of Systems  All other systems reviewed and are negative.       Objective:  Physical Exam  General: AAO x3, NAD  Dermatological: On the medial aspect of the left heel is what appears to be an old blister and there is localized edema there is no fluctuation or crepitation.  There is some erythema without any ascending cellulitis.  There is no fluctuation or crepitation.  There is no malodor.  Vascular: Dorsalis Pedis artery and Posterior Tibial artery pedal pulses are palpable bilateral with immedate capillary fill time.  There is no pain with calf compression, swelling, warmth, erythema.   Neruologic: Sensation decreased  Musculoskeletal: No other areas of discomfort except noted above on the area of the wound, nails.      Assessment:   Ulceration left heel     Plan:  -Treatment options discussed including all alternatives, risks, and complications -Etiology of symptoms were discussed -No concern about developing abscess.  Not able to the ultrasound supports have ordered an MRI. -ABI: WNL -There is no fluid unable to drain today please to continue to monitor closely should there be any worsening needs to report to the emergency room. -Offloading surgical shoe -Bactrim -Monitor for any clinical signs or symptoms of infection and directed to call the office immediately should any occur or go to the ER.  Return for 7-10 days for heel ulcer, infection .  Vivi Barrack DPM

## 2023-01-21 ENCOUNTER — Ambulatory Visit: Payer: Medicare HMO | Admitting: Podiatry

## 2023-01-21 DIAGNOSIS — E08621 Diabetes mellitus due to underlying condition with foot ulcer: Secondary | ICD-10-CM | POA: Diagnosis not present

## 2023-01-21 DIAGNOSIS — E1149 Type 2 diabetes mellitus with other diabetic neurological complication: Secondary | ICD-10-CM

## 2023-01-21 DIAGNOSIS — L97421 Non-pressure chronic ulcer of left heel and midfoot limited to breakdown of skin: Secondary | ICD-10-CM | POA: Diagnosis not present

## 2023-01-23 NOTE — Progress Notes (Signed)
Subjective:   Patient ID: [name] [name], male   DOB: 72 y.o.   MRN: [id number]   HPI Chief Complaint  Patient presents with   Diabetes    Diabetic ulcer  Pt stated that it is about the same  He stated that he has not had the MRI done     72 year old male presents the office today for follow-up evaluation of the wound on his left heel.  He has not yet had the MRI performed.  Denies any fevers or chills.  Denies any drainage or pus.   Review of Systems  All other systems reviewed and are negative.       Objective:  Physical Exam  General: AAO x3, NAD  Dermatological: As pictured below on the medial aspect the left heel exercise issue in the area is preulcerative.  There is no significant erythema, cellulitis.  No fluctuation or crepitation.  There is no malodor.  No significant pain on exam.       Vascular: Dorsalis Pedis artery and Posterior Tibial artery pedal pulses are palpable bilateral with immedate capillary fill time.  There is no pain with calf compression, swelling, warmth, erythema.   Neruologic: Sensation decreased  Musculoskeletal: No other areas of discomfort except noted above on the area of the wound, nails.      Assessment:   Ulceration left heel     Plan:  -Treatment options discussed including all alternatives, risks, and complications -Etiology of symptoms were discussed -With the MRI. -At last appointment there was STILL  concern about developing abscess.  Not able to the ultrasound supports have ordered an MRI. -ABI: WNL -There is no fluid unable to drain today please to continue to monitor closely should there be any worsening needs to report to the emergency room. -Offloading surgical shoe -Will hold further antibiotics at this time.  However if symptoms were to worsen will restart. -Monitor for any clinical signs or symptoms of infection and directed to call the office immediately should any occur or go to the ER.  No follow-ups on  file.  Vivi Barrack DPM

## 2023-01-29 ENCOUNTER — Telehealth: Payer: Self-pay | Admitting: Podiatry

## 2023-01-29 NOTE — Telephone Encounter (Signed)
Patients daughter called and stated that Dason woke up this morning with throbbing pain in his foot. Wants to move MRI date up. She said she will give imaging a call

## 2023-02-10 ENCOUNTER — Ambulatory Visit
Admission: RE | Admit: 2023-02-10 | Discharge: 2023-02-10 | Disposition: A | Discharge status: Home / Self Care | Payer: No Typology Code available for payment source | Source: Ambulatory Visit | Attending: Podiatry | Admitting: Podiatry

## 2023-02-10 DIAGNOSIS — L97421 Non-pressure chronic ulcer of left heel and midfoot limited to breakdown of skin: Secondary | ICD-10-CM

## 2023-02-10 DIAGNOSIS — L02612 Cutaneous abscess of left foot: Secondary | ICD-10-CM

## 2023-02-10 MED ORDER — GADOPICLENOL 0.5 MMOL/ML IV SOLN
9.5000 mL | Freq: Once | INTRAVENOUS | Status: AC | PRN
  Administered 2023-02-10: 9.5 mL via INTRAVENOUS

## 2023-02-18 ENCOUNTER — Ambulatory Visit: Payer: Medicare HMO | Admitting: Podiatry

## 2023-02-21 ENCOUNTER — Encounter: Payer: Self-pay | Admitting: Podiatry

## 2023-02-21 ENCOUNTER — Ambulatory Visit (INDEPENDENT_AMBULATORY_CARE_PROVIDER_SITE_OTHER): Payer: No Typology Code available for payment source | Admitting: Podiatry

## 2023-02-21 ENCOUNTER — Ambulatory Visit: Payer: Medicare HMO | Admitting: Podiatry

## 2023-02-21 DIAGNOSIS — M2041 Other hammer toe(s) (acquired), right foot: Secondary | ICD-10-CM | POA: Diagnosis not present

## 2023-02-21 DIAGNOSIS — M2042 Other hammer toe(s) (acquired), left foot: Secondary | ICD-10-CM | POA: Diagnosis not present

## 2023-02-21 DIAGNOSIS — E1149 Type 2 diabetes mellitus with other diabetic neurological complication: Secondary | ICD-10-CM | POA: Diagnosis not present

## 2023-02-22 ENCOUNTER — Encounter: Payer: Self-pay | Admitting: Podiatry

## 2023-02-22 NOTE — Progress Notes (Signed)
Chief Complaint  Patient presents with   Wound Check    Patient is here for F/U for foot ulcer    HPI: 72 y.o. male presents today for follow-up evaluation of left heel ulcer.  At this point the area appears well-healed with overlying dry flaky skin.  He has completed antibiotics since last visit.  He is requesting prescription for diabetic shoes from the [place].  Past Medical History:  Diagnosis Date   Anxiety    Arthritis    CAD (coronary artery disease) cath in 2011   CAD in native artery 05/18/2014   Cellulitis of toe, right 11/2010   Chronic diastolic heart failure (HCC) 05/18/2014   Cirrhosis of liver (HCC)    ? ETOH, fatty liver    Depression    Diabetes mellitus    Hyperlipidemia 05/18/2014   Hypertension    Nonischemic cardiomyopathy (HCC) 05/18/2014   Stroke (HCC) 2019   eye left    Past Surgical History:  Procedure Laterality Date   ANTERIOR CERVICAL DECOMP/DISCECTOMY FUSION N/A 06/14/2017   Procedure: ANTERIOR CERVICAL DECOMPRESSION/DISCECTOMY FUSION CERVICAL 4- CERVICAL 5;  Surgeon: Julio Sicks, MD;  [facility]: [facility];  Service: Neurosurgery;  Laterality: N/A;  ANTERIOR CERVICAL DECOMPRESSION/DISCECTOMY FUSION CERVICAL 4- CERVICAL 5   BIOPSY  01/01/2022   Procedure: BIOPSY;  Surgeon: Lanelle Bal, DO;  [facility];  Service: Endoscopy;;   CARDIAC CATHETERIZATION  05/24/2010   Carotid duplex  05/23/2010   CARPAL TUNNEL RELEASE     CERVICAL SPINE SURGERY     COLONOSCOPY  FEB 2010 ARS NUR [facility]   NL TCS   COLONOSCOPY N/A 08/01/2015   Dr. Darrick Penna: small internal hemorrhoids, next TCS in 10 years    COLONOSCOPY WITH PROPOFOL N/A 01/01/2022   Procedure: COLONOSCOPY WITH PROPOFOL;  Surgeon: Lanelle Bal, DO;  [facility];  Service: Endoscopy;  Laterality: N/A;  930   DOPPLER ECHOCARDIOGRAPHY  05/09/2010   EF 45-50%   ESOPHAGOGASTRODUODENOSCOPY  02/07/2011   Dr. Darrick Penna: H.pylori gastritis, mild portal gastropathy, treatmetn with  Amoxicillin/Biaxin    ESOPHAGOGASTRODUODENOSCOPY N/A 08/01/2015   Dr. Darrick Penna: Reflux esophagitis, mild gastritis, next EGD in 3 years    ESOPHAGOGASTRODUODENOSCOPY (EGD) WITH PROPOFOL N/A 01/01/2022   Procedure: ESOPHAGOGASTRODUODENOSCOPY (EGD) WITH PROPOFOL;  Surgeon: Lanelle Bal, DO;  [facility];  Service: Endoscopy;  Laterality: N/A;   lower ext atrerial duplex  05/23/2010   NM MYOVIEW LTD  01/08/2008   EF 53%  Low risk scan   POLYPECTOMY  01/01/2022   Procedure: POLYPECTOMY;  Surgeon: Lanelle Bal, DO;  [facility];  Service: Endoscopy;;   sleep study  01/01/2008    Allergies  Allergen Reactions   Niacin Rash    ROS    Physical Exam: There were no vitals filed for this visit.  General: AAO x3, NAD   Dermatological: Medial aspects of the left heel site of previous blister formation is well-healed.  Overlying hyperkeratotic tissue was debrided without iatrogenic bleeding.  Underlying skin is intact without edema, erythema, signs of infection, no fluctuance.   Vascular: Dorsalis Pedis artery and Posterior Tibial artery pedal pulses are palpable bilateral with immedate capillary fill time.  There is no pain with calf compression, swelling, warmth, erythema.    Neruologic: Sensation decreased via Semmes Weinstein monofilament test   Musculoskeletal: No other areas of discomfort except noted above on the area of the wound, nails.  There are hammertoe contractures appreciated.  Assessment/Plan of Care: 1. Type II diabetes mellitus with neurological manifestations (HCC)   2. Hammertoe, bilateral      No orders of the defined types were placed in this encounter.  FOR HOME USE ONLY DME DIABETIC SHOE  Discussed clinical findings with patient today.  Plan: -At this point the heel ulceration appears well-healed -I independently reviewed the MRI which was obtained.  This did show some evidence of superficial ulceration at the time however there was no  signs of underlying abscess or deeper bone infection.  The ulceration has healed at this point. -Prescription written for diabetic shoes.  Do think that the patient would benefit from this due to underlying neuropathy and structural deformity including the hammertoes which puts patient at risk for ulceration. -Patient may transition to regular shoes at this time. -Discussed continued close monitoring of the site to prevent risk of reulceration. -Discussed importance of tight glucose control as his last A1c was noted to be elevated.  Patient expressed good understanding. -Will have patient follow-up in 1 to 2 months to resume diabetic footcare.   Zaylah Blecha L. Marchia Bond, AACFAS [facility]     [address] [place], [place] [address]                Office [phone]  Fax [phone]

## 2023-03-21 ENCOUNTER — Ambulatory Visit: Payer: Medicare HMO | Admitting: Podiatry

## 2023-08-15 ENCOUNTER — Encounter: Payer: Self-pay | Admitting: *Deleted

## 2023-08-29 ENCOUNTER — Ambulatory Visit (INDEPENDENT_AMBULATORY_CARE_PROVIDER_SITE_OTHER): Admitting: Gastroenterology

## 2023-08-29 ENCOUNTER — Encounter: Payer: Self-pay | Admitting: Gastroenterology

## 2023-08-29 ENCOUNTER — Other Ambulatory Visit: Payer: Self-pay | Admitting: *Deleted

## 2023-08-29 VITALS — BP 161/83 | HR 66 | Temp 97.8°F | Ht 66.0 in | Wt 222.8 lb

## 2023-08-29 DIAGNOSIS — K7460 Unspecified cirrhosis of liver: Secondary | ICD-10-CM | POA: Diagnosis not present

## 2023-08-29 DIAGNOSIS — R1013 Epigastric pain: Secondary | ICD-10-CM | POA: Diagnosis not present

## 2023-08-29 DIAGNOSIS — R079 Chest pain, unspecified: Secondary | ICD-10-CM | POA: Insufficient documentation

## 2023-08-29 NOTE — Progress Notes (Signed)
 Gastroenterology Office Note    Referring Provider: Lorre Rosin, NP Primary Care Physician:  Lorre Rosin, NP  Primary GI: Dr. Mordechai April   Chief Complaint   Chief Complaint  Patient presents with   Diarrhea    Was having issues with diarrhea up until about 2 weeks ago.      History of Present Illness   [name] [name] is a 73 y.o. 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 No history of decompensating event. He was last seen in July 2023. Colonoscopy and EGD up-to-date as noted below. His daughter, [name] [name], is here with him today.   He notes he was having diarrhea for months intermittently but given ? Questran by PCP. Has gotten better with this. Having BM daily. He is unsure about his med list.   Noting chest pain in center of chest. Cardiology evaluation in process for chest pain:   Located in center of chest. Has to sit up on edge of bed till eases off. Has to sit up for it to wean. Intermittent. Worsened at night. No N/V. No dyshpagia. No pain after eating. When laying down is worse. Eats between 6-8 pm and then lays down around 9.   Beer every now and then. Feet swell and ankles at time. Off and on about a month.   No overt GI bleeding. No mental status changes, confusion, jaundice, pruritus.    Colonoscopy Aug 2023: internal hemorrhoids, several polyps (tubular adenomas) 5 year surveillance.   EGD Aug 2023: LA Grade A esophagitis, gastritis s/p biopsy, normal duodenum. 3 years screening.     Past Medical History:  Diagnosis Date   Anxiety    Arthritis    CAD (coronary artery disease) cath in 2011   CAD in native artery 05/18/2014   Cellulitis of toe, right 11/2010   Chronic diastolic heart failure (HCC) 05/18/2014   Cirrhosis of liver (HCC)    ? ETOH, fatty liver    Depression    Diabetes mellitus    Hyperlipidemia 05/18/2014   Hypertension    Nonischemic cardiomyopathy (HCC) 05/18/2014   Stroke (HCC) 2019   eye  left    Past Surgical History:  Procedure Laterality Date   ANTERIOR CERVICAL DECOMP/DISCECTOMY FUSION N/A 06/14/2017   Procedure: ANTERIOR CERVICAL DECOMPRESSION/DISCECTOMY FUSION CERVICAL 4- CERVICAL 5;  Surgeon: Agustina Aldrich, MD;  Location: [facility];  Service: Neurosurgery;  Laterality: N/A;  ANTERIOR CERVICAL DECOMPRESSION/DISCECTOMY FUSION CERVICAL 4- CERVICAL 5   BIOPSY  01/01/2022   Procedure: BIOPSY;  Surgeon: Vinetta Greening, DO;  [facility];  Service: Endoscopy;;   CARDIAC CATHETERIZATION  05/24/2010   Carotid duplex  05/23/2010   CARPAL TUNNEL RELEASE     CERVICAL SPINE SURGERY     COLONOSCOPY  FEB 2010 ARS NUR [facility]   NL TCS   COLONOSCOPY N/A 08/01/2015   Dr. Nolene Baumgarten: small internal hemorrhoids, next TCS in 10 years    COLONOSCOPY WITH PROPOFOL  N/A 01/01/2022   Procedure: COLONOSCOPY WITH PROPOFOL ;  Surgeon: Vinetta Greening, DO;  [facility];  Service: Endoscopy;  Laterality: N/A;  930   DOPPLER ECHOCARDIOGRAPHY  05/09/2010   EF 45-50%   ESOPHAGOGASTRODUODENOSCOPY  02/07/2011   Dr. Nolene Baumgarten: H.pylori gastritis, mild portal gastropathy, treatmetn with Amoxicillin/Biaxin    ESOPHAGOGASTRODUODENOSCOPY N/A 08/01/2015   Dr. Nolene Baumgarten: Reflux esophagitis, mild gastritis, next EGD in 3 years    ESOPHAGOGASTRODUODENOSCOPY (EGD) WITH PROPOFOL  N/A 01/01/2022   Procedure: ESOPHAGOGASTRODUODENOSCOPY (  EGD) WITH PROPOFOL ;  Surgeon: Vinetta Greening, DO;  [facility];  Service: Endoscopy;  Laterality: N/A;   lower ext atrerial duplex  05/23/2010   NM MYOVIEW  LTD  01/08/2008   EF 53%  Low risk scan   POLYPECTOMY  01/01/2022   Procedure: POLYPECTOMY;  Surgeon: Vinetta Greening, DO;  [facility];  Service: Endoscopy;;   sleep study  01/01/2008    Current Outpatient Medications  Medication Sig Dispense Refill   Alogliptin Benzoate 25 MG TABS Take 25 mg by mouth in the morning.     aspirin  EC 81 MG tablet Take 81 mg by mouth in the morning.     carvedilol   (COREG ) 25 MG tablet Take 12.5 mg by mouth 2 (two) times daily with a meal.     Cholecalciferol (VITAMIN D3) 50 MCG (2000 UT) TABS Take 2,000 Units by mouth in the morning.     empagliflozin (JARDIANCE) 25 MG TABS tablet Take 10 mg by mouth in the morning.     fish oil-omega-3 fatty acids  1000 MG capsule Take 1 g by mouth once a week.     gabapentin  (NEURONTIN ) 600 MG tablet Take 600 mg by mouth 2 (two) times daily.      glipiZIDE (GLUCOTROL) 10 MG tablet Take 10 mg by mouth in the morning and at bedtime.     glyBURIDE (DIABETA) 5 MG tablet Take 5 mg by mouth in the morning.     insulin  aspart (NOVOLOG ) 100 UNIT/ML injection Inject 18 Units into the skin 3 (three) times daily before meals.     insulin  glargine (LANTUS ) 100 UNIT/ML injection Inject 65 Units into the skin 2 (two) times daily.     methocarbamol  (ROBAXIN ) 500 MG tablet Take 1 tablet (500 mg total) by mouth 2 (two) times daily. 20 tablet 0   naproxen sodium (ALEVE) 220 MG tablet Take 220 mg by mouth daily as needed (for pain or headache).     pantoprazole  (PROTONIX ) 40 MG tablet Take 1 tablet (40 mg total) by mouth daily. 30 tablet 11   rosuvastatin  (CRESTOR ) 10 MG tablet TAKE ONE (1) TABLET EACH DAY 90 tablet 3   sulfamethoxazole -trimethoprim  (BACTRIM  DS) 800-160 MG tablet Take 1 tablet by mouth 2 (two) times daily. 20 tablet 0   tamsulosin (FLOMAX) 0.4 MG CAPS capsule Take 0.4 mg by mouth at bedtime.     No current facility-administered medications for this visit.    Allergies as of 08/29/2023 - Review Complete 08/29/2023  Allergen Reaction Noted   Niacin Rash 10/04/2009    Family History  Problem Relation Age of Onset   Cirrhosis Mother    Heart attack Father    Colon cancer Neg Hx    Liver disease Neg Hx     Social History   Socioeconomic History   Marital status: Single    Spouse name: Not on file   Number of children: 5   Years of education: 9   Highest education level: GED or equivalent  Occupational History    Occupation: Insurance claims handler    Comment: Exposure to gold, tin, nickle  Tobacco Use   Smoking status: Never    Passive exposure: Current   Smokeless tobacco: Never  Vaping Use   Vaping status: Never Used  Substance and Sexual Activity   Alcohol use: Yes    Alcohol/week: 3.0 standard drinks of alcohol    Types: 3 Cans of beer per week    Comment: very rare alcohol use-a  beer maybe every 6 months    Drug use: Yes    Types: Marijuana    Comment: as a young teen   Sexual activity: Not on file  Other Topics Concern   Not on file  Social History Narrative   Lives with daughter in a one story home.  Has 5 children.  On disability.  Education: GED.    Social Drivers of Corporate investment banker Strain: Low Risk  (08/13/2023)   Received from [facility]   Overall Financial Resource Strain (CARDIA)    Difficulty of Paying Living Expenses: Not very hard  Food Insecurity: No Food Insecurity (08/13/2023)   [facility]   Hunger Vital Sign    Worried About Running Out of Food in the Last Year: Never true    Ran Out of Food in the Last Year: Never true  Transportation Needs: No Transportation Needs (08/13/2023)   Received from [facility] - Transportation    Lack of Transportation (Medical): No    Lack of Transportation (Non-Medical): No  Physical Activity: Unknown (08/13/2023)   Received from [facility]   Exercise Vital Sign    Days of Exercise per Week: 0 days    Minutes of Exercise per Session: Not on file  Stress: Stress Concern Present (08/13/2023)   Received from [facility] - Occupational Stress Questionnaire    Feeling of Stress : Rather much  Social Connections: Moderately Integrated (08/13/2023)   Received from [facility]   Social Network    How would you rate your social network (family, work, friends)?: Adequate participation with social networks  Intimate Partner Violence: Not At Risk  (08/13/2023)   Received from [facility]   HITS    Over the last 12 months how often did your partner physically hurt you?: Never    Over the last 12 months how often did your partner insult you or talk down to you?: Never    Over the last 12 months how often did your partner threaten you with physical harm?: Never    Over the last 12 months how often did your partner scream or curse at you?: Never     Review of Systems   Gen: Denies any fever, chills, fatigue, weight loss, lack of appetite.  CV: Denies chest pain, heart palpitations, peripheral edema, syncope.  Resp: Denies shortness of breath at rest or with exertion. Denies wheezing or cough.  GI: Denies dysphagia or odynophagia. Denies jaundice, hematemesis, fecal incontinence. GU : Denies urinary burning, urinary frequency, urinary hesitancy MS: Denies joint pain, muscle weakness, cramps, or limitation of movement.  Derm: Denies rash, itching, dry skin Psych: Denies depression, anxiety, memory loss, and confusion Heme: Denies bruising, bleeding, and enlarged lymph nodes.   Physical Exam   BP (!) 161/83 (BP Location: Right Arm, Patient Position: Sitting, Cuff Size: Large)   Pulse 66   Temp 97.8 F (36.6 C) (Oral)   Ht 5\' 6"  (1.676 m)   Wt 222 lb 12.8 oz (101.1 kg)   SpO2 96%   BMI 35.96 kg/m  General:   Alert and oriented. Pleasant and cooperative. Well-nourished and well-developed.  Head:  Normocephalic and atraumatic. Eyes:  Without icterus Ears:  Normal auditory acuity. Lungs:  Clear to auscultation bilaterally.  Heart:  S1, S2 present without murmurs appreciated.  Abdomen:  +BS, soft, TTP RUQ, epigastric Rectal:  Deferred  Msk:  Symmetrical without gross deformities. Normal posture. Extremities:  Without edema. Neurologic:  Alert and  oriented x4;  grossly normal neurologically. Skin:  Intact without significant lesions or rashes. Psych:  Alert and cooperative. Normal mood and affect.   Assessment   [name] [name] is a 73 y.o. 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   Cirrhosis: overdue for US , labs, AFP. EGD on file 2023 with esophagitis but no varices and will be due for screening in 2026. Unknown Hep A/B immunity status and will check this as well.   Chest pain/epigastric pain: fortunately, he is in process with evaluation by Cardiology. Unable to rule out GI component such as uncontrolled GERD, possible biliary etiology. Doubt pancreatitis but also in differentials. Recommend behavior/diet changes. He is unsure of his meds, so we will definitely recommend PPI daily with Pepcid prn if needed. US  abdomen and labs ordered.  Diarrhea: now resolved. Unclear what medication was given by PCP but suspect Questran. He will message if recurs. Colonoscopy on file 2023 with adenomas and due for surveillance 2028.     PLAN   MELD labs, AFP, lipase, Hep A/B serologies for immunity status  US  abdomen   Make sure to take PPI daily, do not lay down for at least 3 hours after eating at night, pepcid prn  Continue follow-up with cardiology; to ED if worsening  Message us  updated med list  3 month follow-up  Delman Ferns, PhD, ANP-BC [facility] Gastroenterology

## 2023-08-29 NOTE — Patient Instructions (Signed)
 Please have blood work done at [facility] when you are able.  I recommend taking pantoprazole once each morning, 30 minutes before breakfast. Make sure not to lay down for at least 3 hours after eating at night. You can take pepcid (famotidine) 30 minutes prior to bedtime.  I am glad you are having your heart evaluated!  We are also ordering an ultrasound, which will show your liver and gallbladder.  I would like to see you in 3 months! Please message [place] your current medication list.   SO great to see you both!  I enjoyed seeing you again today! I value our relationship and want to provide genuine, compassionate, and quality care. You may receive a survey regarding your visit with me, and I welcome your feedback! Thanks so much for taking the time to complete this. I look forward to seeing you again.      Gelene Mink, PhD, ANP-BC [facility] Gastroenterology

## 2023-08-30 LAB — LIPASE: Lipase: 57 U/L (ref 13–78)

## 2023-08-30 LAB — PROTIME-INR
INR: 1 (ref 0.9–1.2)
Prothrombin Time: 11 s (ref 9.1–12.0)

## 2023-08-30 LAB — HEPATITIS A ANTIBODY, TOTAL: hep A Total Ab: NEGATIVE

## 2023-08-30 LAB — HEPATITIS B SURFACE ANTIBODY,QUALITATIVE: Hep B Surface Ab, Qual: NONREACTIVE

## 2023-08-30 LAB — AFP TUMOR MARKER: AFP, Serum, Tumor Marker: 5.6 ng/mL (ref 0.0–8.4)

## 2023-09-05 ENCOUNTER — Ambulatory Visit (HOSPITAL_COMMUNITY)
Admission: RE | Admit: 2023-09-05 | Discharge: 2023-09-05 | Disposition: A | Discharge status: Home / Self Care | Source: Ambulatory Visit | Attending: Gastroenterology | Admitting: Gastroenterology

## 2023-09-05 DIAGNOSIS — K7460 Unspecified cirrhosis of liver: Secondary | ICD-10-CM | POA: Diagnosis present

## 2023-11-13 ENCOUNTER — Telehealth (HOSPITAL_COMMUNITY): Payer: Self-pay

## 2023-11-13 NOTE — Telephone Encounter (Signed)
 Called patient regarding referral to cardiac rehab. He says he does not feel like he needs to participate at this time. I told him he could call us  back if he changed his mind. Contact information provided. Patient verbalized understanding.

## 2023-11-18 ENCOUNTER — Encounter: Payer: Self-pay | Admitting: Gastroenterology
